# Patient Record
Sex: Female | Born: 1955 | Race: White | Hispanic: No
Health system: Southern US, Community
[De-identification: ages and names within clinical notes are randomized; demographics above are authoritative.]

## PROBLEM LIST (undated history)

## (undated) DIAGNOSIS — A419 Sepsis, unspecified organism: Secondary | ICD-10-CM

## (undated) DIAGNOSIS — J181 Lobar pneumonia, unspecified organism: Secondary | ICD-10-CM

## (undated) DIAGNOSIS — I482 Chronic atrial fibrillation, unspecified: Secondary | ICD-10-CM

## (undated) DIAGNOSIS — J9621 Acute and chronic respiratory failure with hypoxia: Secondary | ICD-10-CM

## (undated) DIAGNOSIS — J90 Pleural effusion, not elsewhere classified: Secondary | ICD-10-CM

## (undated) DIAGNOSIS — R6520 Severe sepsis without septic shock: Secondary | ICD-10-CM

## (undated) DIAGNOSIS — F419 Anxiety disorder, unspecified: Secondary | ICD-10-CM

## (undated) DIAGNOSIS — R609 Edema, unspecified: Secondary | ICD-10-CM

## (undated) DIAGNOSIS — J449 Chronic obstructive pulmonary disease, unspecified: Secondary | ICD-10-CM

## (undated) DIAGNOSIS — F0390 Unspecified dementia without behavioral disturbance: Secondary | ICD-10-CM

## (undated) DIAGNOSIS — F259 Schizoaffective disorder, unspecified: Secondary | ICD-10-CM

## (undated) DIAGNOSIS — R279 Unspecified lack of coordination: Secondary | ICD-10-CM

## (undated) DIAGNOSIS — F329 Major depressive disorder, single episode, unspecified: Secondary | ICD-10-CM

## (undated) HISTORY — PX: OTHER SURGICAL HISTORY: SHX169

---

## 2016-01-09 ENCOUNTER — Encounter (HOSPITAL_COMMUNITY): Payer: Self-pay | Admitting: Emergency Medicine

## 2016-01-09 ENCOUNTER — Emergency Department (HOSPITAL_COMMUNITY)
Admission: EM | Admit: 2016-01-09 | Discharge: 2016-01-09 | Disposition: A | Discharge status: Home / Self Care | Payer: Medicare Other | Attending: Emergency Medicine | Admitting: Emergency Medicine

## 2016-01-09 ENCOUNTER — Emergency Department (HOSPITAL_COMMUNITY): Payer: Medicare Other

## 2016-01-09 DIAGNOSIS — F259 Schizoaffective disorder, unspecified: Secondary | ICD-10-CM | POA: Diagnosis not present

## 2016-01-09 DIAGNOSIS — R05 Cough: Secondary | ICD-10-CM | POA: Insufficient documentation

## 2016-01-09 DIAGNOSIS — Z79899 Other long term (current) drug therapy: Secondary | ICD-10-CM | POA: Insufficient documentation

## 2016-01-09 DIAGNOSIS — F0390 Unspecified dementia without behavioral disturbance: Secondary | ICD-10-CM | POA: Diagnosis not present

## 2016-01-09 DIAGNOSIS — R059 Cough, unspecified: Secondary | ICD-10-CM

## 2016-01-09 HISTORY — DX: Chronic obstructive pulmonary disease, unspecified: J44.9

## 2016-01-09 HISTORY — DX: Edema, unspecified: R60.9

## 2016-01-09 HISTORY — DX: Unspecified dementia, unspecified severity, without behavioral disturbance, psychotic disturbance, mood disturbance, and anxiety: F03.90

## 2016-01-09 HISTORY — DX: Schizoaffective disorder, unspecified: F25.9

## 2016-01-09 HISTORY — DX: Major depressive disorder, single episode, unspecified: F32.9

## 2016-01-09 HISTORY — DX: Anxiety disorder, unspecified: F41.9

## 2016-01-09 HISTORY — DX: Unspecified lack of coordination: R27.9

## 2016-01-09 LAB — I-STAT CHEM 8, ED
BUN: 10 mg/dL (ref 6–20)
CHLORIDE: 98 mmol/L — AB (ref 101–111)
Calcium, Ion: 1.12 mmol/L (ref 1.12–1.23)
Creatinine, Ser: 0.6 mg/dL (ref 0.44–1.00)
Glucose, Bld: 109 mg/dL — ABNORMAL HIGH (ref 65–99)
HEMATOCRIT: 34 % — AB (ref 36.0–46.0)
Hemoglobin: 11.6 g/dL — ABNORMAL LOW (ref 12.0–15.0)
Potassium: 3.6 mmol/L (ref 3.5–5.1)
SODIUM: 140 mmol/L (ref 135–145)
TCO2: 31 mmol/L (ref 0–100)

## 2016-01-09 MED ORDER — DOXYCYCLINE HYCLATE 100 MG PO CAPS: 100.0000 mg | ORAL_CAPSULE | Freq: Two times a day (BID) | ORAL | Status: AC

## 2016-01-09 NOTE — ED Provider Notes (Signed)
The pt is a 60 y/o female with sig behavioural history - very poor historian Comes in with a couple of days of cough - lungs clear, no tachycardia CXR without obvious infiltrate - no fever here - pt to be placed on abx  I have personally viewed and interpreted the imaging and agree with radiologist interpretation.  Medical screening examination/treatment/procedure(s) were conducted as a shared visit with non-physician practitioner(s) and myself.  I personally evaluated the patient during the encounter.  Clinical Impression:   Final diagnoses:  Cough   Meds given in ED:  Medications - No data to display  Discharge Medication List as of 01/09/2016  3:38 PM    START taking these medications   Details  doxycycline (VIBRAMYCIN) 100 MG capsule Take 1 capsule (100 mg total) by mouth 2 (two) times daily., Starting 01/09/2016, Until Discontinued, Print          Eber HongBrian Lilie Vezina, MD 01/11/16 1400

## 2016-01-09 NOTE — ED Notes (Signed)
Pt sleeping soundly.  Awaiting transportation.  Sats 95% on 2L [place].  Did not disturb patient for repeat blood pressure at this time.

## 2016-01-09 NOTE — ED Notes (Signed)
Patient from [facility] in [place] with c/o Cough x 2 days. Noted hypoxia at 83% at facility. Febrile. Given 650 mg Tylenol at facility PTA. Baseline mental status. DNR

## 2016-01-09 NOTE — ED Notes (Signed)
[facility] by EMS.

## 2016-01-09 NOTE — ED Provider Notes (Signed)
CSN: [id number]     Arrival date & time 01/09/16  1342 History   First MD Initiated Contact with Patient 01/09/16 1343     Chief Complaint  Patient presents with  . Cough     (Consider location/radiation/quality/duration/timing/severity/associated sxs/prior Treatment) HPI   Patient is a 60 y/o female with a history of schizoaffective disorder, anxiety, COPD, and dementia who lives at a skilled nursing facility presents to the ED with one week of non-productive cough. Patient was uncooperative with the history. I spoke to Christine PeltonJan Doss, LPN from [facility] [place] who provided the history. She states the patient has had a non-productive cough for one week with associated sinus congestion and intermittent fever. They recorded her temp as 102.1 today. She states numerous patients in the facility have been sent out for similar symptoms.   Past Medical History  Diagnosis Date  . Schizoaffective disorder (HCC)   . Anxiety   . COPD (chronic obstructive pulmonary disease) (HCC)   . Edema   . MDD (major depressive disorder) (HCC)   . Dementia   . Unspecified lack of coordination    Past Surgical History  Procedure Laterality Date  . Patient unable to contribute to surgical history due to dementia/schizophrenia     No family history on file. Social History  Substance Use Topics  . Smoking status: Unknown If Ever Smoked  . Smokeless tobacco: None  . Alcohol Use: No   OB History    No data available     Review of Systems  Unable to perform ROS: Dementia      Allergies  Levaquin and Penicillins  Home Medications   Prior to Admission medications   Medication Sig Start Date End Date Taking? Authorizing Provider  acetaminophen (TYLENOL) 325 MG tablet Take 650 mg by mouth every 4 (four) hours as needed for mild pain.   Yes Historical Provider, MD  albuterol (PROVENTIL HFA;VENTOLIN HFA) 108 (90 Base) MCG/ACT inhaler Inhale 2 puffs into the lungs every 6 (six) hours as needed for  wheezing or shortness of breath.   Yes Historical Provider, MD  calcipotriene (DOVONOX) 0.005 % ointment Apply 1 application topically daily.   Yes Historical Provider, MD  divalproex (DEPAKOTE) 500 MG DR tablet Take 500 mg by mouth 3 (three) times daily.   Yes Historical Provider, MD  furosemide (LASIX) 20 MG tablet Take 20 mg by mouth daily.   Yes Historical Provider, MD  guaifenesin (ROBITUSSIN) 100 MG/5ML syrup Take 200 mg by mouth every 4 (four) hours as needed for cough or congestion.   Yes Historical Provider, MD  loratadine (CLARITIN) 10 MG tablet Take 10 mg by mouth daily as needed for allergies.   Yes Historical Provider, MD  LORazepam (ATIVAN) 1 MG tablet Take 1 mg by mouth every 8 (eight) hours as needed (emotional outburst).   Yes Historical Provider, MD  LORazepam (ATIVAN) 2 MG/ML concentrated solution Take 0.5 mg by mouth 2 (two) times daily as needed (emotional outburst).   Yes Historical Provider, MD  Multiple Vitamin (MULTIVITAMIN WITH MINERALS) TABS tablet Take 1 tablet by mouth daily.   Yes Historical Provider, MD  QUEtiapine (SEROQUEL XR) 200 MG 24 hr tablet Take 200 mg by mouth at bedtime.   Yes Historical Provider, MD  QUEtiapine (SEROQUEL XR) 400 MG 24 hr tablet Take 400 mg by mouth at bedtime.   Yes Historical Provider, MD  senna (SENOKOT) 8.6 MG TABS tablet Take 1 tablet by mouth 2 (two) times daily.   Yes Historical  Provider, MD  sertraline (ZOLOFT) 100 MG tablet Take 100 mg by mouth daily.   Yes Historical Provider, MD  doxycycline (VIBRAMYCIN) 100 MG capsule Take 1 capsule (100 mg total) by mouth 2 (two) times daily. 01/09/16   Cedar Ditullio L Tan Clopper, PA   BP 132/88 mmHg  Pulse 93  Temp(Src) 97.9 F (36.6 C) (Oral)  Resp 18  SpO2 94%  LMP  (LMP Unknown) Physical Exam  Constitutional: She appears well-developed and well-nourished. No distress.  HENT:  Head: Normocephalic and atraumatic.  Eyes: Conjunctivae are normal.  Neck: Normal range of motion.  Cardiovascular:  Normal rate, regular rhythm and normal heart sounds.   Pulmonary/Chest: Effort normal. She has no wheezes. She has no rales.  Noted decreased breath sounds of the left lower lung  Abdominal: Soft. She exhibits distension. Bowel sounds are increased. There is no tenderness.  Large reducible umbilical hernia   Musculoskeletal: Normal range of motion.  Neurological: She is alert. Coordination normal.  Skin: Skin is warm and dry.    ED Course  Procedures (including critical care time) Labs Review Labs Reviewed  I-STAT CHEM 8, ED - Abnormal; Notable for the following:    Chloride 98 (*)    Glucose, Bld 109 (*)    Hemoglobin 11.6 (*)    HCT 34.0 (*)    All other components within normal limits    Imaging Review Dg Chest Portable 1 View  01/09/2016  CLINICAL DATA:  Cough, several days duration.  Associated fever. EXAM: PORTABLE CHEST 1 VIEW COMPARISON:  None. FINDINGS: Heart size is normal. Mediastinal shadows are normal. The upper lungs are clear. No evidence of heart failure. Lung bases are poorly seen given the portable technique and poor inspiration. I think the right base is probably clear. Cannot rule out left lower lobe pneumonia. Consider two-view chest radiography when able. IMPRESSION: Upper lungs clear. Technical limitations at evaluation of the lower lungs. Left lower lobe pneumonia not excluded. Consider two-view chest radiography when able. Electronically Signed   By: Paulina Fusi M.D.   On: 01/09/2016 14:18   I have personally reviewed and evaluated these images and lab results as part of my medical decision-making.   MDM   Final diagnoses:  Cough    Patient has been diagnosed with cough. I reviewed the chest xray which stated, "cannot rule out left lower lobe pneumonia". Pt is not ill appearing, not immunocompromised, and lives at a skilled nursing facilty, therefore I feel like the they can be treated as an OP with abx therapy. Pt has been advised to return to the ED if  symptoms worsen or they do not improve. Nurse communicated plan of care with nurse at skilled nursing facility.      Jerre Simon, PA 01/09/16 1544  Eber Hong, MD 01/11/16 1400

## 2016-01-09 NOTE — ED Notes (Signed)
Pt very difficult to assess due to mental capacity.  Reports a cough for a few days and left ear pain.  Lung sounds diminished and assessment limited by body habitus and mobility.

## 2016-01-09 NOTE — Discharge Instructions (Signed)
Follow up with your primary care provider tomorrow.  Return if your symptoms worsen.  Cough, Adult A cough helps to clear your throat and lungs. A cough may last only 2-3 weeks (acute), or it may last longer than 8 weeks (chronic). Many different things can cause a cough. A cough may be a sign of an illness or another medical condition. HOME CARE  Pay attention to any changes in your cough.  Take medicines only as told by your doctor.  If you were prescribed an antibiotic medicine, take it as told by your doctor. Do not stop taking it even if you start to feel better.  Talk with your doctor before you try using a cough medicine.  Drink enough fluid to keep your pee (urine) clear or pale yellow.  If the air is dry, use a cold steam vaporizer or humidifier in your home.  Stay away from things that make you cough at work or at home.  If your cough is worse at night, try using extra pillows to raise your head up higher while you sleep.  Do not smoke, and try not to be around smoke. If you need help quitting, ask your doctor.  Do not have caffeine.  Do not drink alcohol.  Rest as needed. GET HELP IF:  You have new problems (symptoms).  You cough up yellow fluid (pus).  Your cough does not get better after 2-3 weeks, or your cough gets worse.  Medicine does not help your cough and you are not sleeping well.  You have pain that gets worse or pain that is not helped with medicine.  You have a fever.  You are losing weight and you do not know why.  You have night sweats. GET HELP RIGHT AWAY IF:  You cough up blood.  You have trouble breathing.  Your heartbeat is very fast.   This information is not intended to replace advice given to you by your health care provider. Make sure you discuss any questions you have with your health care provider.   Document Released: 05/24/2011 Document Revised: 06/01/2015 Document Reviewed: 11/17/2014 Elsevier Interactive Patient  Education [employer]2016 [employer] Inc.

## 2016-01-09 NOTE — ED Notes (Signed)
Report called to [facility] in [place] to [place]. Awaiting EMS transport back to SNF.

## 2019-01-20 ENCOUNTER — Other Ambulatory Visit (HOSPITAL_COMMUNITY): Payer: Medicare Other

## 2019-01-20 ENCOUNTER — Inpatient Hospital Stay
Admit: 2019-01-20 | Discharge: 2019-01-28 | Payer: Medicare Other | Source: Ambulatory Visit | Attending: Internal Medicine | Admitting: Internal Medicine

## 2019-01-20 DIAGNOSIS — J449 Chronic obstructive pulmonary disease, unspecified: Secondary | ICD-10-CM

## 2019-01-20 DIAGNOSIS — A419 Sepsis, unspecified organism: Secondary | ICD-10-CM | POA: Diagnosis present

## 2019-01-20 DIAGNOSIS — J189 Pneumonia, unspecified organism: Secondary | ICD-10-CM

## 2019-01-20 DIAGNOSIS — R6520 Severe sepsis without septic shock: Secondary | ICD-10-CM | POA: Diagnosis present

## 2019-01-20 DIAGNOSIS — J90 Pleural effusion, not elsewhere classified: Secondary | ICD-10-CM | POA: Diagnosis present

## 2019-01-20 DIAGNOSIS — I482 Chronic atrial fibrillation, unspecified: Secondary | ICD-10-CM | POA: Diagnosis present

## 2019-01-20 DIAGNOSIS — J9690 Respiratory failure, unspecified, unspecified whether with hypoxia or hypercapnia: Secondary | ICD-10-CM

## 2019-01-20 DIAGNOSIS — Z9889 Other specified postprocedural states: Secondary | ICD-10-CM

## 2019-01-20 DIAGNOSIS — R0902 Hypoxemia: Secondary | ICD-10-CM

## 2019-01-20 DIAGNOSIS — R7981 Abnormal blood-gas level: Secondary | ICD-10-CM

## 2019-01-20 DIAGNOSIS — J181 Lobar pneumonia, unspecified organism: Secondary | ICD-10-CM | POA: Diagnosis present

## 2019-01-20 DIAGNOSIS — J9621 Acute and chronic respiratory failure with hypoxia: Secondary | ICD-10-CM | POA: Diagnosis present

## 2019-01-20 HISTORY — DX: Chronic atrial fibrillation, unspecified: I48.20

## 2019-01-20 HISTORY — DX: Lobar pneumonia, unspecified organism: J18.1

## 2019-01-20 HISTORY — DX: Sepsis, unspecified organism: A41.9

## 2019-01-20 HISTORY — DX: Acute and chronic respiratory failure with hypoxia: J96.21

## 2019-01-20 HISTORY — DX: Severe sepsis without septic shock: R65.20

## 2019-01-20 HISTORY — DX: Pleural effusion, not elsewhere classified: J90

## 2019-01-21 DIAGNOSIS — R6520 Severe sepsis without septic shock: Secondary | ICD-10-CM

## 2019-01-21 DIAGNOSIS — J90 Pleural effusion, not elsewhere classified: Secondary | ICD-10-CM

## 2019-01-21 DIAGNOSIS — I482 Chronic atrial fibrillation, unspecified: Secondary | ICD-10-CM | POA: Diagnosis not present

## 2019-01-21 DIAGNOSIS — J449 Chronic obstructive pulmonary disease, unspecified: Secondary | ICD-10-CM

## 2019-01-21 DIAGNOSIS — A419 Sepsis, unspecified organism: Secondary | ICD-10-CM

## 2019-01-21 DIAGNOSIS — J181 Lobar pneumonia, unspecified organism: Secondary | ICD-10-CM | POA: Diagnosis not present

## 2019-01-21 DIAGNOSIS — J9621 Acute and chronic respiratory failure with hypoxia: Secondary | ICD-10-CM | POA: Diagnosis not present

## 2019-01-21 LAB — TSH: TSH: 1.397 u[IU]/mL (ref 0.350–4.500)

## 2019-01-21 LAB — HEMOGLOBIN A1C
Hgb A1c MFr Bld: 5.5 % (ref 4.8–5.6)
Mean Plasma Glucose: 111.15 mg/dL

## 2019-01-21 LAB — CBC WITH DIFFERENTIAL/PLATELET
Abs Immature Granulocytes: 0.15 10*3/uL — ABNORMAL HIGH (ref 0.00–0.07)
Basophils Absolute: 0 10*3/uL (ref 0.0–0.1)
Basophils Relative: 0 %
Eosinophils Absolute: 0 10*3/uL (ref 0.0–0.5)
Eosinophils Relative: 0 %
HCT: 37.7 % (ref 36.0–46.0)
Hemoglobin: 11.5 g/dL — ABNORMAL LOW (ref 12.0–15.0)
Immature Granulocytes: 1 %
Lymphocytes Relative: 8 %
Lymphs Abs: 0.8 10*3/uL (ref 0.7–4.0)
MCH: 30.7 pg (ref 26.0–34.0)
MCHC: 30.5 g/dL (ref 30.0–36.0)
MCV: 100.5 fL — ABNORMAL HIGH (ref 80.0–100.0)
Monocytes Absolute: 0.3 10*3/uL (ref 0.1–1.0)
Monocytes Relative: 3 %
Neutro Abs: 9.2 10*3/uL — ABNORMAL HIGH (ref 1.7–7.7)
Neutrophils Relative %: 88 %
Platelets: 330 10*3/uL (ref 150–400)
RBC: 3.75 MIL/uL — ABNORMAL LOW (ref 3.87–5.11)
RDW: 13.9 % (ref 11.5–15.5)
WBC: 10.5 10*3/uL (ref 4.0–10.5)
nRBC: 0 % (ref 0.0–0.2)

## 2019-01-21 LAB — COMPREHENSIVE METABOLIC PANEL
ALT: 31 U/L (ref 0–44)
AST: 29 U/L (ref 15–41)
Albumin: 2.1 g/dL — ABNORMAL LOW (ref 3.5–5.0)
Alkaline Phosphatase: 61 U/L (ref 38–126)
Anion gap: 7 (ref 5–15)
BUN: 15 mg/dL (ref 6–20)
CO2: 34 mmol/L — ABNORMAL HIGH (ref 22–32)
Calcium: 9 mg/dL (ref 8.9–10.3)
Chloride: 99 mmol/L (ref 98–111)
Creatinine, Ser: 0.52 mg/dL (ref 0.44–1.00)
GFR calc Af Amer: >60 mL/min (ref >60)
GFR calc non Af Amer: >60 mL/min (ref >60)
Glucose, Bld: 110 mg/dL — ABNORMAL HIGH (ref 70–99)
Potassium: 4.1 mmol/L (ref 3.5–5.1)
Sodium: 140 mmol/L (ref 135–145)
Total Bilirubin: 0.8 mg/dL (ref 0.3–1.2)
Total Protein: 6.7 g/dL (ref 6.5–8.1)

## 2019-01-21 LAB — URINALYSIS, ROUTINE W REFLEX MICROSCOPIC
Bilirubin Urine: NEGATIVE
Glucose, UA: NEGATIVE mg/dL
Ketones, ur: NEGATIVE mg/dL
Nitrite: NEGATIVE
Protein, ur: NEGATIVE mg/dL
Specific Gravity, Urine: 1.005 (ref 1.005–1.030)
pH: 6 (ref 5.0–8.0)

## 2019-01-21 LAB — MAGNESIUM: Magnesium: 2 mg/dL (ref 1.7–2.4)

## 2019-01-21 LAB — PROTIME-INR
INR: 1.4 — ABNORMAL HIGH (ref 0.8–1.2)
Prothrombin Time: 16.7 seconds — ABNORMAL HIGH (ref 11.4–15.2)

## 2019-01-21 LAB — PHOSPHORUS: Phosphorus: 3.2 mg/dL (ref 2.5–4.6)

## 2019-01-21 LAB — VANCOMYCIN, TROUGH: Vancomycin Tr: 18 ug/mL (ref 15–20)

## 2019-01-21 LAB — T4, FREE: Free T4: 0.84 ng/dL (ref 0.82–1.77)

## 2019-01-21 NOTE — Consult Note (Signed)
Pulmonary Critical Care Medicine [facility] [place]  PULMONARY SERVICE  Date of Service: 01/21/2019  PULMONARY CRITICAL CARE Maralynn Peaches  HWY:[id number]  DOB: Feb 13, 1956   DOA: 01/20/2019  Referring Physician: Carron Curie, MD  HPI: Christine Dunlap is a 63 y.o. female seen for follow up of Acute on Chronic Respiratory Failure.  Patient has multiple medical problems including dementia COPD anemia heart failure atrial fibrillation anxiety depression hypothyroidism.  [facility].  Evaluation was done and found to have pneumonia in the left side.  Patient also had a pleural effusion and pericardial effusion.  Patient also had developed atrial fibrillation.  Was worked up treated with antibiotics also underwent thoracentesis.  The antibiotics included vancomycin and meropenem.  As far as the pericardial effusion was concerned cardiology did not feel there was a need for window at this time.  Patient is now transferred to our facility for further management.  Review of Systems:  ROS performed and is unremarkable other than noted above.  Past Medical History:  Diagnosis Date   Anxiety    COPD (chronic obstructive pulmonary disease) (HCC)    Dementia    Edema    MDD (major depressive disorder)    Schizoaffective disorder (HCC)    Unspecified lack of coordination     Past Surgical History:  Procedure Laterality Date   Patient unable to contribute to surgical history due to dementia/schizophrenia      Social History:    reports that she does not drink alcohol or use drugs. No history on file for tobacco.  Family History: Non-Contributory to the present illness  Allergies  Allergen Reactions   Levaquin [Levofloxacin In D5w]    Penicillins     Medications: Reviewed on Rounds  Physical Exam:  Vitals: Temperature 97.7 pulse 90 respiratory rate 21 blood pressure 160/68 saturations  92%  Ventilator Settings currently on 40 L FiO2 80% heated high flow   General: Comfortable at this time  Eyes: Grossly normal lids, irises & conjunctiva  ENT: grossly tongue is normal  Neck: no obvious mass  Cardiovascular: S1-S2 normal no gallop or rub  Respiratory: Coarse breath sounds noted bilaterally  Abdomen: Soft nontender  Skin: no rash seen on limited exam  Musculoskeletal: not rigid  Psychiatric:unable to assess  Neurologic: no seizure no involuntary movements         Labs on Admission:  Basic Metabolic Panel: Recent Labs  Lab 01/21/19 0439  NA 140  K 4.1  CL 99  CO2 34*  GLUCOSE 110*  BUN 15  CREATININE 0.52  CALCIUM 9.0  MG 2.0  PHOS 3.2    No results for input(s): PHART, PCO2ART, PO2ART, HCO3, O2SAT in the last 168 hours.  Liver Function Tests: Recent Labs  Lab 01/21/19 0439  AST 29  ALT 31  ALKPHOS 61  BILITOT 0.8  PROT 6.7  ALBUMIN 2.1*   No results for input(s): LIPASE, AMYLASE in the last 168 hours. No results for input(s): AMMONIA in the last 168 hours.  CBC: Recent Labs  Lab 01/21/19 0439  WBC 10.5  NEUTROABS 9.2*  HGB 11.5*  HCT 37.7  MCV 100.5*  PLT 330    Cardiac Enzymes: No results for input(s): CKTOTAL, CKMB, CKMBINDEX, TROPONINI in the last 168 hours.  BNP (last 3 results) No results for input(s): BNP in the last 8760 hours.  ProBNP (last 3 results) No results for input(s): PROBNP in the last 8760 hours.  Radiological Exams on Admission: Dg Chest Port 1 View  Result Date: 01/20/2019 CLINICAL DATA:  Respiratory failure EXAM: PORTABLE CHEST 1 VIEW COMPARISON:  01/09/2016 FINDINGS: Cardiac enlargement. Progression of moderately large bilateral pleural effusions. Progression of bibasilar atelectasis. Negative for edema Chronic fracture right humeral neck. IMPRESSION: Progression of bilateral effusions and bibasilar atelectasis likely due to fluid overload. Negative for pulmonary edema. Electronically Signed    By: Marlan Palauharles  Clark M.D.   On: 01/20/2019 19:19       Assessment/Plan Active Problems:   Acute on chronic respiratory failure with hypoxia (HCC)   Severe sepsis (HCC)   Pleural effusion, left   Lobar pneumonia, unspecified organism (HCC)   Chronic atrial fibrillation with rapid ventricular response   1. Acute on chronic respiratory failure with hypoxia patient remains on full support on the heated high flow at this time requiring 80% FiO2.  Chest x-ray showed progression with fluid overload likely would recommend aggressive diuresis to try to improve her fluid status.  This may help us to wean the FiO2 down 2. Severe sepsis right now is hemodynamically stable however patient had a possible empyema chest tube was not placed.  There was progression of bilateral effusions as noted above 3. Bilateral pleural effusions on the chest x-ray appeared to be progressive.  Patient has had thoracentesis done.  However I think a repeat thoracentesis may be necessary at this point 4. Lobar pneumonia treated with vancomycin and meropenem.  Patient also received cefepime and Flagyl. 5. Chronic atrial fibrillation right now the rate is controlled patient has been on beta-blockers and digoxin.  Supportive care  I have personally seen and evaluated the patient, evaluated laboratory and imaging results, formulated the assessment and plan and placed orders. The Patient requires high complexity decision making for assessment and support.  Case was discussed on Rounds with the Respiratory Therapy Staff Time Spent 70minutes  Yevonne PaxSaadat A Adrik Khim, MD [facility] Pulmonary Critical Care Medicine Sleep Medicine

## 2019-01-22 ENCOUNTER — Encounter: Payer: Self-pay | Admitting: Internal Medicine

## 2019-01-22 DIAGNOSIS — J9621 Acute and chronic respiratory failure with hypoxia: Secondary | ICD-10-CM | POA: Diagnosis present

## 2019-01-22 DIAGNOSIS — J449 Chronic obstructive pulmonary disease, unspecified: Secondary | ICD-10-CM | POA: Diagnosis not present

## 2019-01-22 DIAGNOSIS — I482 Chronic atrial fibrillation, unspecified: Secondary | ICD-10-CM | POA: Diagnosis present

## 2019-01-22 DIAGNOSIS — A419 Sepsis, unspecified organism: Secondary | ICD-10-CM | POA: Diagnosis present

## 2019-01-22 DIAGNOSIS — J181 Lobar pneumonia, unspecified organism: Secondary | ICD-10-CM | POA: Diagnosis present

## 2019-01-22 DIAGNOSIS — J90 Pleural effusion, not elsewhere classified: Secondary | ICD-10-CM | POA: Diagnosis present

## 2019-01-22 DIAGNOSIS — R6520 Severe sepsis without septic shock: Secondary | ICD-10-CM

## 2019-01-22 LAB — MAGNESIUM: Magnesium: 1.8 mg/dL (ref 1.7–2.4)

## 2019-01-22 LAB — CBC
HCT: 37.3 % (ref 36.0–46.0)
Hemoglobin: 11.7 g/dL — ABNORMAL LOW (ref 12.0–15.0)
MCH: 31.1 pg (ref 26.0–34.0)
MCHC: 31.4 g/dL (ref 30.0–36.0)
MCV: 99.2 fL (ref 80.0–100.0)
Platelets: 340 10*3/uL (ref 150–400)
RBC: 3.76 MIL/uL — ABNORMAL LOW (ref 3.87–5.11)
RDW: 13.9 % (ref 11.5–15.5)
WBC: 10 10*3/uL (ref 4.0–10.5)
nRBC: 0 % (ref 0.0–0.2)

## 2019-01-22 LAB — URINE CULTURE: Culture: NO GROWTH

## 2019-01-22 LAB — BASIC METABOLIC PANEL
Anion gap: 7 (ref 5–15)
BUN: 17 mg/dL (ref 6–20)
CO2: 33 mmol/L — ABNORMAL HIGH (ref 22–32)
Calcium: 8.7 mg/dL — ABNORMAL LOW (ref 8.9–10.3)
Chloride: 99 mmol/L (ref 98–111)
Creatinine, Ser: 0.55 mg/dL (ref 0.44–1.00)
GFR calc Af Amer: >60 mL/min (ref >60)
GFR calc non Af Amer: >60 mL/min (ref >60)
Glucose, Bld: 117 mg/dL — ABNORMAL HIGH (ref 70–99)
Potassium: 4.3 mmol/L (ref 3.5–5.1)
Sodium: 139 mmol/L (ref 135–145)

## 2019-01-22 LAB — PHOSPHORUS: Phosphorus: 3 mg/dL (ref 2.5–4.6)

## 2019-01-22 NOTE — Progress Notes (Addendum)
Pulmonary Critical Care Medicine [employer] [facility] [place]   PULMONARY CRITICAL CARE SERVICE  PROGRESS NOTE  Date of Service: 01/22/2019  Christine Dunlap  JWL:[id number]  DOB: December 06, 1955   DOA: 01/20/2019  Referring Physician: Carron Curie, MD  HPI: Christine Dunlap is a 63 y.o. female seen for follow up of Acute on Chronic Respiratory Failure.  Patient continues on heated high flow nasal cannula at this time 30 L and 80% FiO2.  Medications: Reviewed on Rounds  Physical Exam:  Vitals: Pulse 57 respirations 12 BP 148/85 O2 sat 94% temp 97.1  Ventilator Settings heated high flow 30 L 80% FiO2  . General: Comfortable at this time . Eyes: Grossly normal lids, irises & conjunctiva . ENT: grossly tongue is normal . Neck: no obvious mass . Cardiovascular: S1 S2 normal no gallop . Respiratory: Coarse breath sounds . Abdomen: soft . Skin: no rash seen on limited exam . Musculoskeletal: not rigid . Psychiatric:unable to assess . Neurologic: no seizure no involuntary movements         Lab Data:   Basic Metabolic Panel: Recent Labs  Lab 01/21/19 0439 01/22/19 0500  NA 140 139  K 4.1 4.3  CL 99 99  CO2 34* 33*  GLUCOSE 110* 117*  BUN 15 17  CREATININE 0.52 0.55  CALCIUM 9.0 8.7*  MG 2.0 1.8  PHOS 3.2 3.0    ABG: No results for input(s): PHART, PCO2ART, PO2ART, HCO3, O2SAT in the last 168 hours.  Liver Function Tests: Recent Labs  Lab 01/21/19 0439  AST 29  ALT 31  ALKPHOS 61  BILITOT 0.8  PROT 6.7  ALBUMIN 2.1*   No results for input(s): LIPASE, AMYLASE in the last 168 hours. No results for input(s): AMMONIA in the last 168 hours.  CBC: Recent Labs  Lab 01/21/19 0439 01/22/19 0500  WBC 10.5 10.0  NEUTROABS 9.2*  --   HGB 11.5* 11.7*  HCT 37.7 37.3  MCV 100.5* 99.2  PLT 330 340    Cardiac Enzymes: No results for input(s): CKTOTAL, CKMB, CKMBINDEX, TROPONINI in the last 168 hours.  BNP (last 3 results) No results for input(s): BNP in  the last 8760 hours.  ProBNP (last 3 results) No results for input(s): PROBNP in the last 8760 hours.  Radiological Exams: Dg Chest Port 1 View  Result Date: 01/20/2019 CLINICAL DATA:  Respiratory failure EXAM: PORTABLE CHEST 1 VIEW COMPARISON:  01/09/2016 FINDINGS: Cardiac enlargement. Progression of moderately large bilateral pleural effusions. Progression of bibasilar atelectasis. Negative for edema Chronic fracture right humeral neck. IMPRESSION: Progression of bilateral effusions and bibasilar atelectasis likely due to fluid overload. Negative for pulmonary edema. Electronically Signed   By: Marlan Palau M.D.   On: 01/20/2019 19:19    Assessment/Plan Active Problems:   Acute on chronic respiratory failure with hypoxia (HCC)   Severe sepsis (HCC)   Pleural effusion, left   Lobar pneumonia, unspecified organism (HCC)   Chronic atrial fibrillation with rapid ventricular response   1. Acute on chronic respiratory failure with hypoxia patient remains on heated high flow nasal cannula at this time 30 L and 80%.  Her chest x-ray continues to show fluid progression and will likely need a thoracentesis if aggressive diuresis does not work. 2. Severe sepsis hemodynamically stable 3. Bilateral pleural effusions on chest x-ray appears to be progressing.  Repeat thoracentesis may be necessary. 4. Lobar pneumonia treated with vancomycin and meropenem as well as cefepime and Flagyl. 5. Chronic atrial fibrillation rate controlled continue supportive care  I have personally seen and evaluated the patient, evaluated laboratory and imaging results, formulated the assessment and plan and placed orders. The Patient requires high complexity decision making for assessment and support.  Case was discussed on Rounds with the Respiratory Therapy Staff  Allyne Gee, MD [facility] Pulmonary Critical Care Medicine Sleep Medicine

## 2019-01-23 ENCOUNTER — Other Ambulatory Visit (HOSPITAL_COMMUNITY): Payer: Medicare Other

## 2019-01-23 DIAGNOSIS — J181 Lobar pneumonia, unspecified organism: Secondary | ICD-10-CM | POA: Diagnosis not present

## 2019-01-23 DIAGNOSIS — J9621 Acute and chronic respiratory failure with hypoxia: Secondary | ICD-10-CM | POA: Diagnosis not present

## 2019-01-23 DIAGNOSIS — I482 Chronic atrial fibrillation, unspecified: Secondary | ICD-10-CM | POA: Diagnosis not present

## 2019-01-23 DIAGNOSIS — J449 Chronic obstructive pulmonary disease, unspecified: Secondary | ICD-10-CM | POA: Diagnosis not present

## 2019-01-23 NOTE — Consult Note (Signed)
Referring Physician:  Lysbeth GalasKimberly Dunlap is an 63 y.o. female.                       Chief Complaint: Large pleural effusion  HPI: 63 year old female from area [facility] has acute on chronic respiratory failure with large bilateral pleural effusions. She has trivial pericardial effusion on echocardiogram. She has past medical history of dementia, COPD, atrial fibrillation, hypothyroidism and pneumonia.  Past Medical History:  Diagnosis Date  . Acute on chronic respiratory failure with hypoxia (HCC)   . Anxiety   . Chronic atrial fibrillation with rapid ventricular response   . COPD (chronic obstructive pulmonary disease) (HCC)   . Dementia (HCC)   . Edema   . Lobar pneumonia, unspecified organism (HCC)   . MDD (major depressive disorder)   . Pleural effusion, left   . Schizoaffective disorder (HCC)   . Severe sepsis (HCC)   . Unspecified lack of coordination       Past Surgical History:  Procedure Laterality Date  . Patient unable to contribute to surgical history due to dementia/schizophrenia      No family history on file. Social History:  reports that she does not drink alcohol or use drugs. No history on file for tobacco.  Allergies:  Allergies  Allergen Reactions  . Levaquin [Levofloxacin In D5w]   . Penicillins     Medications Prior to Admission  Medication Sig Dispense Refill  . acetaminophen (TYLENOL) 325 MG tablet Take 650 mg by mouth every 4 (four) hours as needed for mild pain.    [name]. albuterol (PROVENTIL HFA;VENTOLIN HFA) 108 (90 Base) MCG/ACT inhaler Inhale 2 puffs into the lungs every 6 (six) hours as needed for wheezing or shortness of breath.    . calcipotriene (DOVONOX) 0.005 % ointment Apply 1 application topically daily.    . divalproex (DEPAKOTE) 500 MG DR tablet Take 500 mg by mouth 3 (three) times daily.    [name]. doxycycline (VIBRAMYCIN) 100 MG capsule Take 1 capsule (100 mg total) by mouth 2 (two) times daily. 20 capsule 0  . furosemide (LASIX) 20 MG tablet  Take 20 mg by mouth daily.    [name]. guaifenesin (ROBITUSSIN) 100 MG/5ML syrup Take 200 mg by mouth every 4 (four) hours as needed for cough or congestion.    [name]. loratadine (CLARITIN) 10 MG tablet Take 10 mg by mouth daily as needed for allergies.    [name]. LORazepam (ATIVAN) 1 MG tablet Take 1 mg by mouth every 8 (eight) hours as needed (emotional outburst).    . LORazepam (ATIVAN) 2 MG/ML concentrated solution Take 0.5 mg by mouth 2 (two) times daily as needed (emotional outburst).    . Multiple Vitamin (MULTIVITAMIN WITH MINERALS) TABS tablet Take 1 tablet by mouth daily.    . QUEtiapine (SEROQUEL XR) 200 MG 24 hr tablet Take 200 mg by mouth at bedtime.    [name]. QUEtiapine (SEROQUEL XR) 400 MG 24 hr tablet Take 400 mg by mouth at bedtime.    . senna (SENOKOT) 8.6 MG TABS tablet Take 1 tablet by mouth 2 (two) times daily.    . sertraline (ZOLOFT) 100 MG tablet Take 100 mg by mouth daily.      Results for orders placed or performed during the hospital encounter of 01/20/19 (from the past 48 hour(s))  Vancomycin, trough     Status: None   Collection Time: 01/21/19  9:33 PM  Result Value Ref Range   Vancomycin Tr 18 15 -  20 ug/mL    Comment: Performed at [facility] Lab, [address], [place], [place] [address]  CBC     Status: Abnormal   Collection Time: 01/22/19  5:00 AM  Result Value Ref Range   WBC 10.0 4.0 - 10.5 K/uL   RBC 3.76 (L) 3.87 - 5.11 MIL/uL   Hemoglobin 11.7 (L) 12.0 - 15.0 g/dL   HCT 22.6 33.3 - 54.5 %   MCV 99.2 80.0 - 100.0 fL   MCH 31.1 26.0 - 34.0 pg   MCHC 31.4 30.0 - 36.0 g/dL   RDW 62.5 63.8 - 93.7 %   Platelets 340 150 - 400 K/uL   nRBC 0.0 0.0 - 0.2 %    Comment: Performed at [facility], [address], [place], [place] [address]  Basic metabolic panel     Status: Abnormal   Collection Time: 01/22/19  5:00 AM  Result Value Ref Range   Sodium 139 135 - 145 mmol/L   Potassium 4.3 3.5 - 5.1 mmol/L   Chloride 99 98 - 111 mmol/L   CO2 33 (H) 22 - 32 mmol/L    Glucose, Bld 117 (H) 70 - 99 mg/dL   BUN 17 6 - 20 mg/dL   Creatinine, Ser 6.81 0.44 - 1.00 mg/dL   Calcium 8.7 (L) 8.9 - 10.3 mg/dL   GFR calc non Af Amer >60 >60 mL/min   GFR calc Af Amer >60 >60 mL/min   Anion gap 7 5 - 15    Comment: Performed at [facility] Lab, [address], [place], [place] [address]  Magnesium     Status: None   Collection Time: 01/22/19  5:00 AM  Result Value Ref Range   Magnesium 1.8 1.7 - 2.4 mg/dL    Comment: Performed at [facility] Lab, [address] [place], [place] [address]  Phosphorus     Status: None   Collection Time: 01/22/19  5:00 AM  Result Value Ref Range   Phosphorus 3.0 2.5 - 4.6 mg/dL    Comment: Performed at [facility] Lab, [address], [place], [place] [address]   Dg Chest Port 1 View  Result Date: 01/23/2019 CLINICAL DATA:  Chronic obstructive pulmonary disease. EXAM: PORTABLE CHEST 1 VIEW COMPARISON:  Radiograph of January 20, 2019. FINDINGS: Stable cardiomegaly. No pneumothorax is noted. Bilateral pleural effusions are noted with associated atelectasis. Old right proximal humeral fracture is noted. IMPRESSION: Bilateral pleural effusions are noted with associated atelectasis. Electronically Signed   By: Lupita Raider M.D.   On: 01/23/2019 07:31    Review Of Systems As per PMH. Unable to obtain from patient.   There were no vitals taken for this visit. There is no height or weight on file to calculate BMI. General appearance: alert, cooperative, appears stated age and mild respiratory distress Head: Normocephalic, atraumatic. Eyes: Brown eyes, pink conjunctiva, corneas clear.  Neck: No adenopathy, no carotid bruit, no JVD, supple, symmetrical, trachea midline and thyroid not enlarged. Resp: Clearing to auscultation bilaterally. Cardio: Irregular rate and rhythm, S1, S2 normal, II/VI systolic murmur, no click, rub or gallop GI: Soft, non-tender; bowel sounds normal; no organomegaly. Extremities: No edema, cyanosis or  clubbing. Skin: Warm and dry.  Neurologic: Alert and oriented X 0, normal strength.   Assessment/Plan Bilateral large pleural effusion. Small pericardial effusion without tamponade Atrial fibrillation COPD Hypothyroidism Dementia  Continue medical treatment. Agree with thoracentesis per pulmonary.  Ricki Rodriguez, MD  01/23/2019, 7:56  PM

## 2019-01-23 NOTE — Progress Notes (Addendum)
Pulmonary Critical Care Medicine [facility] [place]   PULMONARY CRITICAL CARE SERVICE  PROGRESS NOTE  Date of Service: 01/23/2019  Christine Dunlap  YOK:[id number]  DOB: [birth year]   DOA: 01/20/2019  Referring Physician: Carron Curie, MD  HPI: Christine Dunlap is a 63 y.o. female seen for follow up of Acute on Chronic Respiratory Failure.  Patient remains on heated high flow nasal cannula 30 L and 70% FiO2.  Currently satting in the low 90s no distress noted.  Medications: Reviewed on Rounds  Physical Exam:  Vitals: Pulse 62 respirations 15 BP 121/65 O2 sat 92% temp 96.1  Ventilator Settings heated high flow 30 L 70% FiO2  . General: Comfortable at this time . Eyes: Grossly normal lids, irises & conjunctiva . ENT: grossly tongue is normal . Neck: no obvious mass . Cardiovascular: S1 S2 normal no gallop . Respiratory: Coarse breath sounds . Abdomen: soft . Skin: no rash seen on limited exam . Musculoskeletal: not rigid . Psychiatric:unable to assess . Neurologic: no seizure no involuntary movements         Lab Data:   Basic Metabolic Panel: Recent Labs  Lab 01/21/19 0439 01/22/19 0500  NA 140 139  K 4.1 4.3  CL 99 99  CO2 34* 33*  GLUCOSE 110* 117*  BUN 15 17  CREATININE 0.52 0.55  CALCIUM 9.0 8.7*  MG 2.0 1.8  PHOS 3.2 3.0    ABG: No results for input(s): PHART, PCO2ART, PO2ART, HCO3, O2SAT in the last 168 hours.  Liver Function Tests: Recent Labs  Lab 01/21/19 0439  AST 29  ALT 31  ALKPHOS 61  BILITOT 0.8  PROT 6.7  ALBUMIN 2.1*   No results for input(s): LIPASE, AMYLASE in the last 168 hours. No results for input(s): AMMONIA in the last 168 hours.  CBC: Recent Labs  Lab 01/21/19 0439 01/22/19 0500  WBC 10.5 10.0  NEUTROABS 9.2*  --   HGB 11.5* 11.7*  HCT 37.7 37.3  MCV 100.5* 99.2  PLT 330 340    Cardiac Enzymes: No results for input(s): CKTOTAL, CKMB, CKMBINDEX, TROPONINI in the last 168 hours.  BNP (last 3  results) No results for input(s): BNP in the last 8760 hours.  ProBNP (last 3 results) No results for input(s): PROBNP in the last 8760 hours.  Radiological Exams: Dg Chest Port 1 View  Result Date: 01/23/2019 CLINICAL DATA:  Chronic obstructive pulmonary disease. EXAM: PORTABLE CHEST 1 VIEW COMPARISON:  Radiograph of January 20, 2019. FINDINGS: Stable cardiomegaly. No pneumothorax is noted. Bilateral pleural effusions are noted with associated atelectasis. Old right proximal humeral fracture is noted. IMPRESSION: Bilateral pleural effusions are noted with associated atelectasis. Electronically Signed   By: Lupita Raider M.D.   On: 01/23/2019 07:31    Assessment/Plan Active Problems:   Acute on chronic respiratory failure with hypoxia (HCC)   Severe sepsis (HCC)   Pleural effusion, left   Lobar pneumonia, unspecified organism (HCC)   Chronic atrial fibrillation with rapid ventricular response   1. Acute on chronic respiratory failure with hypoxia patient remains on heated high flow nasal cannula at this time 30 L and 70% FiO2.  Continue aggressive pulmonary toilet and secretion management. 2. Severe sepsis hemodynamically stable 3. Bilateral pleural effusions on chest x-ray appears to be progressing a repeat thoracentesis may be necessary if the IV diuresis is unsuccessful 4. Lobar pneumonia treated with vancomycin and meropenem as well as cefepime and Flagyl continue to monitor 5. Chronic atrial fibrillation rate controlled continue  supportive care   I have personally seen and evaluated the patient, evaluated laboratory and imaging results, formulated the assessment and plan and placed orders. The Patient requires high complexity decision making for assessment and support.  Case was discussed on Rounds with the Respiratory Therapy Staff  Yevonne Pax, MD [facility] Pulmonary Critical Care Medicine Sleep Medicine

## 2019-01-23 NOTE — Progress Notes (Signed)
  Echocardiogram 2D Echocardiogram has been performed.  Christine Dunlap Christine Dunlap 01/23/2019, 1:56 PM

## 2019-01-24 DIAGNOSIS — J181 Lobar pneumonia, unspecified organism: Secondary | ICD-10-CM | POA: Diagnosis not present

## 2019-01-24 DIAGNOSIS — J9621 Acute and chronic respiratory failure with hypoxia: Secondary | ICD-10-CM | POA: Diagnosis not present

## 2019-01-24 DIAGNOSIS — I482 Chronic atrial fibrillation, unspecified: Secondary | ICD-10-CM | POA: Diagnosis not present

## 2019-01-24 DIAGNOSIS — J449 Chronic obstructive pulmonary disease, unspecified: Secondary | ICD-10-CM | POA: Diagnosis not present

## 2019-01-24 LAB — PHOSPHORUS: Phosphorus: 3.5 mg/dL (ref 2.5–4.6)

## 2019-01-24 LAB — CBC
HCT: 35.7 % — ABNORMAL LOW (ref 36.0–46.0)
Hemoglobin: 11.4 g/dL — ABNORMAL LOW (ref 12.0–15.0)
MCH: 31.5 pg (ref 26.0–34.0)
MCHC: 31.9 g/dL (ref 30.0–36.0)
MCV: 98.6 fL (ref 80.0–100.0)
Platelets: 308 10*3/uL (ref 150–400)
RBC: 3.62 MIL/uL — ABNORMAL LOW (ref 3.87–5.11)
RDW: 14 % (ref 11.5–15.5)
WBC: 11.5 10*3/uL — ABNORMAL HIGH (ref 4.0–10.5)
nRBC: 0 % (ref 0.0–0.2)

## 2019-01-24 LAB — BASIC METABOLIC PANEL
Anion gap: 9 (ref 5–15)
BUN: 21 mg/dL — ABNORMAL HIGH (ref 6–20)
CO2: 32 mmol/L (ref 22–32)
Calcium: 9 mg/dL (ref 8.9–10.3)
Chloride: 100 mmol/L (ref 98–111)
Creatinine, Ser: 0.47 mg/dL (ref 0.44–1.00)
GFR calc Af Amer: >60 mL/min (ref >60)
GFR calc non Af Amer: >60 mL/min (ref >60)
Glucose, Bld: 125 mg/dL — ABNORMAL HIGH (ref 70–99)
Potassium: 3.9 mmol/L (ref 3.5–5.1)
Sodium: 141 mmol/L (ref 135–145)

## 2019-01-24 LAB — MAGNESIUM: Magnesium: 1.9 mg/dL (ref 1.7–2.4)

## 2019-01-24 NOTE — Progress Notes (Addendum)
Pulmonary Critical Care Medicine [facility] [place]   PULMONARY CRITICAL CARE SERVICE  PROGRESS NOTE  Date of Service: 01/24/2019  Christine Dunlap  [id number]:[id number]  DOB: 02/16/1956   DOA: 01/20/2019  Referring Physician: Carron Curie, MD  HPI: Christine Dunlap is a 63 y.o. female seen for follow up of Acute on Chronic Respiratory Failure.  Patient is currently on 5 L of oxygen via nasal cannula satting well with no distress noted.  Medications: Reviewed on Rounds  Physical Exam:  Vitals: Pulse 100 respirations 25 BP 113/68 O2 sat 95% temp 97.1  Ventilator Settings 5 L nasal cannula  . General: Comfortable at this time . Eyes: Grossly normal lids, irises & conjunctiva . ENT: grossly tongue is normal . Neck: no obvious mass . Cardiovascular: S1 S2 normal no gallop . Respiratory: Coarse breath sounds . Abdomen: soft . Skin: no rash seen on limited exam . Musculoskeletal: not rigid . Psychiatric:unable to assess . Neurologic: no seizure no involuntary movements         Lab Data:   Basic Metabolic Panel: Recent Labs  Lab 01/21/19 0439 01/22/19 0500 01/24/19 0326  NA 140 139 141  K 4.1 4.3 3.9  CL 99 99 100  CO2 34* 33* 32  GLUCOSE 110* 117* 125*  BUN 15 17 21*  CREATININE 0.52 0.55 0.47  CALCIUM 9.0 8.7* 9.0  MG 2.0 1.8 1.9  PHOS 3.2 3.0 3.5    ABG: No results for input(s): PHART, PCO2ART, PO2ART, HCO3, O2SAT in the last 168 hours.  Liver Function Tests: Recent Labs  Lab 01/21/19 0439  AST 29  ALT 31  ALKPHOS 61  BILITOT 0.8  PROT 6.7  ALBUMIN 2.1*   No results for input(s): LIPASE, AMYLASE in the last 168 hours. No results for input(s): AMMONIA in the last 168 hours.  CBC: Recent Labs  Lab 01/21/19 0439 01/22/19 0500 01/24/19 0326  WBC 10.5 10.0 11.5*  NEUTROABS 9.2*  --   --   HGB 11.5* 11.7* 11.4*  HCT 37.7 37.3 35.7*  MCV 100.5* 99.2 98.6  PLT 330 340 308    Cardiac Enzymes: No results for input(s): CKTOTAL, CKMB,  CKMBINDEX, TROPONINI in the last 168 hours.  BNP (last 3 results) No results for input(s): BNP in the last 8760 hours.  ProBNP (last 3 results) No results for input(s): PROBNP in the last 8760 hours.  Radiological Exams: Dg Chest Port 1 View  Result Date: 01/23/2019 CLINICAL DATA:  Chronic obstructive pulmonary disease. EXAM: PORTABLE CHEST 1 VIEW COMPARISON:  Radiograph of January 20, 2019. FINDINGS: Stable cardiomegaly. No pneumothorax is noted. Bilateral pleural effusions are noted with associated atelectasis. Old right proximal humeral fracture is noted. IMPRESSION: Bilateral pleural effusions are noted with associated atelectasis. Electronically Signed   By: Lupita Raider M.D.   On: 01/23/2019 07:31    Assessment/Plan Active Problems:   Acute on chronic respiratory failure with hypoxia (HCC)   Severe sepsis (HCC)   Pleural effusion, left   Lobar pneumonia, unspecified organism (HCC)   Chronic atrial fibrillation with rapid ventricular response   1. Acute on chronic respiratory failure with hypoxia patient is currently on 5 L of oxygen via nasal cannula satting mid 90s at this time no distress noted. 2. Severe sepsis hemodynamically stable 3. Bilateral pleural effusions on chest x-ray continue to monitor 4. Lobar pneumonia treated continue to monitor 5. Chronic atrial fibrillation rate controlled continue supportive care   I have personally seen and evaluated the patient, evaluated laboratory  and imaging results, formulated the assessment and plan and placed orders. The Patient requires high complexity decision making for assessment and support.  Case was discussed on Rounds with the Respiratory Therapy Staff  Allyne Gee, MD [facility] Pulmonary Critical Care Medicine Sleep Medicine

## 2019-01-25 ENCOUNTER — Other Ambulatory Visit (HOSPITAL_COMMUNITY): Payer: Medicare Other

## 2019-01-25 DIAGNOSIS — J9621 Acute and chronic respiratory failure with hypoxia: Secondary | ICD-10-CM | POA: Diagnosis not present

## 2019-01-25 DIAGNOSIS — J449 Chronic obstructive pulmonary disease, unspecified: Secondary | ICD-10-CM | POA: Diagnosis not present

## 2019-01-25 DIAGNOSIS — J181 Lobar pneumonia, unspecified organism: Secondary | ICD-10-CM | POA: Diagnosis not present

## 2019-01-25 DIAGNOSIS — I482 Chronic atrial fibrillation, unspecified: Secondary | ICD-10-CM | POA: Diagnosis not present

## 2019-01-25 NOTE — Progress Notes (Addendum)
Pulmonary Critical Care [facility] [place]   PULMONARY CRITICAL CARE SERVICE  PROGRESS NOTE  Date of Service: 01/25/2019  Christine Dunlap  ZOX:[id number]  DOB: 1956-05-03   DOA: 01/20/2019  Referring Physician: Carron CurieAli Hijazi, MD  HPI: Christine Dunlap is a 63 y.o. female seen for follow up of Acute on Chronic Respiratory Failure.  Patient remains on 5 L of oxygen via nasal cannula.  Staff reports patient is refusing any and all medications.  Medications: Reviewed on Rounds  Physical Exam:  Vitals: Pulse 76 respirations 20 BP 130/67 O2 sat 97% temp 97.3  Ventilator Settings 5 L nasal cannula  . General: Comfortable at this time . Eyes: Grossly normal lids, irises & conjunctiva . ENT: grossly tongue is normal . Neck: no obvious mass . Cardiovascular: S1 S2 normal no gallop . Respiratory: Coarse breath sounds . Abdomen: soft . Skin: no rash seen on limited exam . Musculoskeletal: not rigid . Psychiatric:unable to assess . Neurologic: no seizure no involuntary movements         Lab Data:   Basic Metabolic Panel: Recent Labs  Lab 01/21/19 0439 01/22/19 0500 01/24/19 0326  NA 140 139 141  K 4.1 4.3 3.9  CL 99 99 100  CO2 34* 33* 32  GLUCOSE 110* 117* 125*  BUN 15 17 21*  CREATININE 0.52 0.55 0.47  CALCIUM 9.0 8.7* 9.0  MG 2.0 1.8 1.9  PHOS 3.2 3.0 3.5    ABG: No results for input(s): PHART, PCO2ART, PO2ART, HCO3, O2SAT in the last 168 hours.  Liver Function Tests: Recent Labs  Lab 01/21/19 0439  AST 29  ALT 31  ALKPHOS 61  BILITOT 0.8  PROT 6.7  ALBUMIN 2.1*   No results for input(s): LIPASE, AMYLASE in the last 168 hours. No results for input(s): AMMONIA in the last 168 hours.  CBC: Recent Labs  Lab 01/21/19 0439 01/22/19 0500 01/24/19 0326  WBC 10.5 10.0 11.5*  NEUTROABS 9.2*  --   --   HGB 11.5* 11.7* 11.4*  HCT 37.7 37.3 35.7*  MCV 100.5* 99.2 98.6  PLT 330 340 308    Cardiac Enzymes: No results for input(s):  CKTOTAL, CKMB, CKMBINDEX, TROPONINI in the last 168 hours.  BNP (last 3 results) No results for input(s): BNP in the last 8760 hours.  ProBNP (last 3 results) No results for input(s): PROBNP in the last 8760 hours.  Radiological Exams: Dg Chest Port 1 View  Result Date: 01/25/2019 CLINICAL DATA:  Pneumonia and COPD EXAM: PORTABLE CHEST 1 VIEW COMPARISON:  Two days ago FINDINGS: There is hazy density of the mid and lower chest attributed to layering pleural fluid, which obscures the lower lungs. Cardiopericardial enlargement. No pneumothorax or cephalized blood flow. IMPRESSION: Bilateral pleural effusions that are likely moderate or large. Electronically Signed   By: Marnee SpringJonathon  Watts M.D.   On: 01/25/2019 07:16    Assessment/Plan Active Problems:   Acute on chronic respiratory failure with hypoxia (HCC)   Severe sepsis (HCC)   Pleural effusion, left   Lobar pneumonia, unspecified organism (HCC)   Chronic atrial fibrillation with rapid ventricular response   1. Acute on chronic respiratory failure with hypoxia patient is currently on 5 L of oxygen via nasal cannula satting 98%.  No distress noted at this time.  Continue pulmonary toilet and secretion management 2. Severe sepsis hemodynamically stable 3. Bilateral pleural effusions on chest x-ray continue to monitor 4. Lobar pneumonia treated continue to monitor 5. Chronic atrial fibrillation rate controlled continue  supportive care   I have personally seen and evaluated the patient, evaluated laboratory and imaging results, formulated the assessment and plan and placed orders. The Patient requires high complexity decision making for assessment and support.  Case was discussed on Rounds with the Respiratory Therapy Staff  Yevonne Pax, MD [facility] Pulmonary Critical Care Medicine Sleep Medicine

## 2019-01-26 ENCOUNTER — Other Ambulatory Visit (HOSPITAL_COMMUNITY): Payer: Medicare Other

## 2019-01-26 DIAGNOSIS — I482 Chronic atrial fibrillation, unspecified: Secondary | ICD-10-CM | POA: Diagnosis not present

## 2019-01-26 DIAGNOSIS — J449 Chronic obstructive pulmonary disease, unspecified: Secondary | ICD-10-CM | POA: Diagnosis not present

## 2019-01-26 DIAGNOSIS — J9621 Acute and chronic respiratory failure with hypoxia: Secondary | ICD-10-CM | POA: Diagnosis not present

## 2019-01-26 DIAGNOSIS — J181 Lobar pneumonia, unspecified organism: Secondary | ICD-10-CM | POA: Diagnosis not present

## 2019-01-26 LAB — PROTEIN, PLEURAL OR PERITONEAL FLUID: Total protein, fluid: 4.2 g/dL

## 2019-01-26 LAB — CBC
HCT: 40.6 % (ref 36.0–46.0)
Hemoglobin: 13 g/dL (ref 12.0–15.0)
MCH: 31.6 pg (ref 26.0–34.0)
MCHC: 32 g/dL (ref 30.0–36.0)
MCV: 98.5 fL (ref 80.0–100.0)
Platelets: 340 10*3/uL (ref 150–400)
RBC: 4.12 MIL/uL (ref 3.87–5.11)
RDW: 14.5 % (ref 11.5–15.5)
WBC: 15.8 10*3/uL — ABNORMAL HIGH (ref 4.0–10.5)
nRBC: 0 % (ref 0.0–0.2)

## 2019-01-26 LAB — GLUCOSE, PLEURAL OR PERITONEAL FLUID: Glucose, Fluid: 138 mg/dL

## 2019-01-26 LAB — BODY FLUID CELL COUNT WITH DIFFERENTIAL
Eos, Fluid: 0 %
Lymphs, Fluid: 4 %
Monocyte-Macrophage-Serous Fluid: 11 % — ABNORMAL LOW (ref 50–90)
Neutrophil Count, Fluid: 85 % — ABNORMAL HIGH (ref 0–25)
Total Nucleated Cell Count, Fluid: 250 cu mm (ref 0–1000)

## 2019-01-26 LAB — RENAL FUNCTION PANEL
Albumin: 2.2 g/dL — ABNORMAL LOW (ref 3.5–5.0)
Anion gap: 8 (ref 5–15)
BUN: 17 mg/dL (ref 6–20)
CO2: 35 mmol/L — ABNORMAL HIGH (ref 22–32)
Calcium: 8.9 mg/dL (ref 8.9–10.3)
Chloride: 100 mmol/L (ref 98–111)
Creatinine, Ser: 0.5 mg/dL (ref 0.44–1.00)
GFR calc Af Amer: >60 mL/min (ref >60)
GFR calc non Af Amer: >60 mL/min (ref >60)
Glucose, Bld: 137 mg/dL — ABNORMAL HIGH (ref 70–99)
Phosphorus: 3.8 mg/dL (ref 2.5–4.6)
Potassium: 3.5 mmol/L (ref 3.5–5.1)
Sodium: 143 mmol/L (ref 135–145)

## 2019-01-26 LAB — GRAM STAIN

## 2019-01-26 LAB — MAGNESIUM: Magnesium: 1.7 mg/dL (ref 1.7–2.4)

## 2019-01-26 MED ORDER — LIDOCAINE HCL (PF) 1 % IJ SOLN
INTRAMUSCULAR | Status: AC
  Filled 2019-01-26: qty 30

## 2019-01-26 NOTE — Procedures (Addendum)
PROCEDURE SUMMARY:  Successful image-guided right thoracentesis. Yielded 900 milliliters of hazy amber fluid. Patient tolerated procedure well. No immediate complications. EBL = 5 mL.  Specimen was sent for labs. CXR ordered.  Elwin Mocha PA-C 01/26/2019 12:40 PM

## 2019-01-26 NOTE — Progress Notes (Addendum)
Pulmonary Critical Care Medicine [facility] [place]   PULMONARY CRITICAL CARE SERVICE  PROGRESS NOTE  Date of Service: 01/26/2019  Christine Dunlap  [id number]  DOB: 1955-11-11   DOA: 01/20/2019  Referring Physician: Carron Curie, MD  HPI: Christine Dunlap is a 63 y.o. female seen for follow up of Acute on Chronic Respiratory Failure.  Staff reports patient is more cooperative today and has been taking medications.  Had thoracentesis today and 900 cc was drained out.  Currently on 10 L Oxymizer and satting in the mid 90s.  Medications: Reviewed on Rounds  Physical Exam:  Vitals: Pulse 91 respirations 20 BP 110/64 O2 sat 94% temp 98.4  Ventilator Settings 10 L Oxymizer  . General: Comfortable at this time . Eyes: Grossly normal lids, irises & conjunctiva . ENT: grossly tongue is normal . Neck: no obvious mass . Cardiovascular: S1 S2 normal no gallop . Respiratory: Coarse breath sounds . Abdomen: soft . Skin: no rash seen on limited exam . Musculoskeletal: not rigid . Psychiatric:unable to assess . Neurologic: no seizure no involuntary movements         Lab Data:   Basic Metabolic Panel: Recent Labs  Lab 01/21/19 0439 01/22/19 0500 01/24/19 0326 01/26/19 0704  NA 140 139 141 143  K 4.1 4.3 3.9 3.5  CL 99 99 100 100  CO2 34* 33* 32 35*  GLUCOSE 110* 117* 125* 137*  BUN 15 17 21* 17  CREATININE 0.52 0.55 0.47 0.50  CALCIUM 9.0 8.7* 9.0 8.9  MG 2.0 1.8 1.9 1.7  PHOS 3.2 3.0 3.5 3.8    ABG: No results for input(s): PHART, PCO2ART, PO2ART, HCO3, O2SAT in the last 168 hours.  Liver Function Tests: Recent Labs  Lab 01/21/19 0439 01/26/19 0704  AST 29  --   ALT 31  --   ALKPHOS 61  --   BILITOT 0.8  --   PROT 6.7  --   ALBUMIN 2.1* 2.2*   No results for input(s): LIPASE, AMYLASE in the last 168 hours. No results for input(s): AMMONIA in the last 168 hours.  CBC: Recent Labs  Lab 01/21/19 0439 01/22/19 0500 01/24/19 0326  01/26/19 0704  WBC 10.5 10.0 11.5* 15.8*  NEUTROABS 9.2*  --   --   --   HGB 11.5* 11.7* 11.4* 13.0  HCT 37.7 37.3 35.7* 40.6  MCV 100.5* 99.2 98.6 98.5  PLT 330 340 308 340    Cardiac Enzymes: No results for input(s): CKTOTAL, CKMB, CKMBINDEX, TROPONINI in the last 168 hours.  BNP (last 3 results) No results for input(s): BNP in the last 8760 hours.  ProBNP (last 3 results) No results for input(s): PROBNP in the last 8760 hours.  Radiological Exams: Dg Chest Port 1 View  Result Date: 01/25/2019 CLINICAL DATA:  Pneumonia and COPD EXAM: PORTABLE CHEST 1 VIEW COMPARISON:  Two days ago FINDINGS: There is hazy density of the mid and lower chest attributed to layering pleural fluid, which obscures the lower lungs. Cardiopericardial enlargement. No pneumothorax or cephalized blood flow. IMPRESSION: Bilateral pleural effusions that are likely moderate or large. Electronically Signed   By: Marnee Spring M.D.   On: 01/25/2019 07:16    Assessment/Plan Active Problems:   Acute on chronic respiratory failure with hypoxia (HCC)   Severe sepsis (HCC)   Pleural effusion, left   Lobar pneumonia, unspecified organism (HCC)   Chronic atrial fibrillation with rapid ventricular response   1. Acute on chronic respiratory failure with hypoxia patient is currently  on 10 L of oxygen via Oxymizer.  Satting well with no distress.  Continue pulmonary toilet and secretion management 2. Severe sepsis hemodynamically stable 3. Bilateral pleural effusions on chest x-ray thoracentesis with 900 cc removed today. 4. Lobar pneumonia treated continue to monitor 5. Chronic atrial fibrillation rate controlled continue supportive care   I have personally seen and evaluated the patient, evaluated laboratory and imaging results, formulated the assessment and plan and placed orders. The Patient requires high complexity decision making for assessment and support.  Case was discussed on Rounds with the Respiratory  Therapy Staff  Yevonne PaxSaadat A Cameshia Cressman, MD [facility] Pulmonary Critical Care Medicine Sleep Medicine

## 2019-01-27 ENCOUNTER — Other Ambulatory Visit (HOSPITAL_COMMUNITY): Payer: Medicare Other

## 2019-01-27 DIAGNOSIS — J9621 Acute and chronic respiratory failure with hypoxia: Secondary | ICD-10-CM | POA: Diagnosis not present

## 2019-01-27 DIAGNOSIS — J449 Chronic obstructive pulmonary disease, unspecified: Secondary | ICD-10-CM | POA: Diagnosis not present

## 2019-01-27 DIAGNOSIS — I482 Chronic atrial fibrillation, unspecified: Secondary | ICD-10-CM | POA: Diagnosis not present

## 2019-01-27 DIAGNOSIS — J181 Lobar pneumonia, unspecified organism: Secondary | ICD-10-CM | POA: Diagnosis not present

## 2019-01-27 LAB — MAGNESIUM: Magnesium: 1.9 mg/dL (ref 1.7–2.4)

## 2019-01-27 LAB — BLOOD GAS, ARTERIAL
Acid-Base Excess: 14 mmol/L — ABNORMAL HIGH (ref 0.0–2.0)
Acid-Base Excess: 13.6 mmol/L — ABNORMAL HIGH (ref 0.0–2.0)
Bicarbonate: 38.3 mmol/L — ABNORMAL HIGH (ref 20.0–28.0)
Bicarbonate: 37.8 mmol/L — ABNORMAL HIGH (ref 20.0–28.0)
FIO2: 85
FIO2: 100
O2 Content: 30 L/min
O2 Content: 35 L/min
O2 Saturation: 85.1 %
O2 Saturation: 88.2 %
Patient temperature: 98.6
Patient temperature: 98.6
pCO2 arterial: 48.3 mmHg — ABNORMAL HIGH (ref 32.0–48.0)
pCO2 arterial: 48.2 mmHg — ABNORMAL HIGH (ref 32.0–48.0)
pH, Arterial: 7.51 — ABNORMAL HIGH (ref 7.350–7.450)
pH, Arterial: 7.506 — ABNORMAL HIGH (ref 7.350–7.450)
pO2, Arterial: 49.5 mmHg — ABNORMAL LOW (ref 83.0–108.0)
pO2, Arterial: 54.4 mmHg — ABNORMAL LOW (ref 83.0–108.0)

## 2019-01-27 LAB — CK TOTAL AND CKMB (NOT AT ARMC)
CK, MB: 0.5 ng/mL (ref 0.5–5.0)
Total CK: <5 U/L — ABNORMAL LOW (ref 38–234)

## 2019-01-27 LAB — TROPONIN I: Troponin I: <0.03 ng/mL (ref <0.03)

## 2019-01-27 NOTE — Progress Notes (Addendum)
Pulmonary Critical Care Medicine [facility] [place]   PULMONARY CRITICAL CARE SERVICE  PROGRESS NOTE  Date of Service: 01/27/2019  Christine Dunlap  HEN:[id number]  DOB: May 20, 1956   DOA: 01/20/2019  Referring Physician: Carron Curie, MD  HPI: Christine Dunlap is a 63 y.o. female seen for follow up of Acute on Chronic Respiratory Failure.  Patient currently on high flow nasal cannula 20 L and 80% FiO2.  Currently satting 90 to 91%.  Medications: Reviewed on Rounds  Physical Exam:  Vitals: Pulse 114 respirations 14 BP 116/72 O2 sat 97% temp 97.9  Ventilator Settings heated high flow 20 L 90%  . General: Comfortable at this time . Eyes: Grossly normal lids, irises & conjunctiva . ENT: grossly tongue is normal . Neck: no obvious mass . Cardiovascular: S1 S2 normal no gallop . Respiratory: Coarse breath sounds . Abdomen: soft . Skin: no rash seen on limited exam . Musculoskeletal: not rigid . Psychiatric:unable to assess . Neurologic: no seizure no involuntary movements         Lab Data:   Basic Metabolic Panel: Recent Labs  Lab 01/21/19 0439 01/22/19 0500 01/24/19 0326 01/26/19 0704 01/27/19 0426  NA 140 139 141 143  --   K 4.1 4.3 3.9 3.5  --   CL 99 99 100 100  --   CO2 34* 33* 32 35*  --   GLUCOSE 110* 117* 125* 137*  --   BUN 15 17 21* 17  --   CREATININE 0.52 0.55 0.47 0.50  --   CALCIUM 9.0 8.7* 9.0 8.9  --   MG 2.0 1.8 1.9 1.7 1.9  PHOS 3.2 3.0 3.5 3.8  --     ABG: No results for input(s): PHART, PCO2ART, PO2ART, HCO3, O2SAT in the last 168 hours.  Liver Function Tests: Recent Labs  Lab 01/21/19 0439 01/26/19 0704  AST 29  --   ALT 31  --   ALKPHOS 61  --   BILITOT 0.8  --   PROT 6.7  --   ALBUMIN 2.1* 2.2*   No results for input(s): LIPASE, AMYLASE in the last 168 hours. No results for input(s): AMMONIA in the last 168 hours.  CBC: Recent Labs  Lab 01/21/19 0439 01/22/19 0500 01/24/19 0326 01/26/19 0704  WBC 10.5  10.0 11.5* 15.8*  NEUTROABS 9.2*  --   --   --   HGB 11.5* 11.7* 11.4* 13.0  HCT 37.7 37.3 35.7* 40.6  MCV 100.5* 99.2 98.6 98.5  PLT 330 340 308 340    Cardiac Enzymes: No results for input(s): CKTOTAL, CKMB, CKMBINDEX, TROPONINI in the last 168 hours.  BNP (last 3 results) No results for input(s): BNP in the last 8760 hours.  ProBNP (last 3 results) No results for input(s): PROBNP in the last 8760 hours.  Radiological Exams: Dg Chest Port 1 View  Result Date: 01/27/2019 CLINICAL DATA:  Low oxygen saturation EXAM: PORTABLE CHEST 1 VIEW COMPARISON:  Jan 26, 2019 FINDINGS: The heart size and mediastinal contours are stable. The heart size is enlarged. Moderate left pleural effusion is identified with consolidation of left lung base. Small right pleural effusion is unchanged compared prior exam. No evidence of pulmonary edema. The visualized skeletal structures are stable. IMPRESSION: Moderate left pleural effusion with consolidation of left lung base is unchanged. Small right pleural effusion unchanged. Electronically Signed   By: Sherian Rein M.D.   On: 01/27/2019 16:23   Dg Chest Port 1 View  Result Date: 01/26/2019 CLINICAL DATA:  Right pleural effusion.  Status post thoracentesis. EXAM: PORTABLE CHEST 1 VIEW 1:58 p.m. COMPARISON:  Chest x-rays dated 01/25/2019, 6:14 a.m., 01/23/2019 and 01/20/2019 FINDINGS: There is no pneumothorax after right thoracentesis. There has been marked improvement in the now small right pleural effusion. Left pleural effusion appears slightly larger. Pulmonary vascularity is normal. Cardiac silhouette is obscured by the effusions. IMPRESSION: 1. No pneumothorax after right thoracentesis. 2. Diminished right effusion, now small. 3. Slightly increased moderate left effusion. Electronically Signed   By: Francene BoyersJames  Maxwell M.D.   On: 01/26/2019 14:22   [place] Thoracentesis Asp Pleural Space W/img Guide  Result Date: 01/26/2019 INDICATION: Patient with history of COPD,  acute on chronic respiratory failure with hypoxia, and bilateral pleural effusions. Request is made for diagnostic and therapeutic right thoracentesis. EXAM: ULTRASOUND GUIDED DIAGNOSTIC AND THERAPEUTIC RIGHT THORACENTESIS MEDICATIONS: 10 mL 1% lidocaine COMPLICATIONS: None immediate. PROCEDURE: An ultrasound guided thoracentesis was thoroughly discussed with the patient and questions answered. The benefits, risks, alternatives and complications were also discussed. The patient understands and wishes to proceed with the procedure. Written consent was obtained. Ultrasound was performed to localize and mark an adequate pocket of fluid in the right chest. The area was then prepped and draped in the normal sterile fashion. 1% Lidocaine was used for local anesthesia. Under ultrasound guidance a 6 Fr Safe-T-Centesis catheter was introduced. Thoracentesis was performed. The catheter was removed and a dressing applied. FINDINGS: A total of approximately 900 mL of hazy amber fluid was removed. Samples were sent to the laboratory as requested by the clinical team. IMPRESSION: Successful ultrasound guided right thoracentesis yielding 900 mL of pleural fluid. Read by: Elwin MochaAlexandra Louk, PA-C Electronically Signed   By: Judie PetitM.  Shick M.D.   On: 01/26/2019 14:26    Assessment/Plan Active Problems:   Acute on chronic respiratory failure with hypoxia (HCC)   Severe sepsis (HCC)   Pleural effusion, left   Lobar pneumonia, unspecified organism (HCC)   Chronic atrial fibrillation with rapid ventricular response   1. Acute on chronic respiratory failure with hypoxia currently home peak flow 20 L 90% FiO2 doing well at this at this time and satting the low 90s.  Continue secretion management pulmonary toilet 2. Severe sepsis hemodynamically stable 3. Bilateral pleural effusions on chest thoracentesis yesterday continue monitor 4. Lobar pneumonia treated continue to treat 5. Chronic atrial fibrillation rate controlled continue  supportive care   I have personally seen and evaluated the patient, evaluated laboratory and imaging results, formulated the assessment and plan and placed orders. The Patient requires high complexity decision making for assessment and support.  Case was discussed on Rounds with the Respiratory Therapy Staff  Yevonne PaxSaadat A Lidwina Kaner, MD [facility] Pulmonary Critical Care Medicine Sleep Medicine

## 2019-01-28 ENCOUNTER — Encounter
Disposition: A | Discharge status: Other Acute Inpatient Hospital | Payer: Self-pay | Source: Ambulatory Visit | Attending: Internal Medicine

## 2019-01-28 ENCOUNTER — Inpatient Hospital Stay (HOSPITAL_COMMUNITY)
Admission: AD | Admit: 2019-01-28 | Discharge: 2019-02-23 | DRG: 314 | Disposition: E | Payer: Medicare Other | Source: Other Acute Inpatient Hospital | Attending: Cardiovascular Disease | Admitting: Cardiovascular Disease

## 2019-01-28 ENCOUNTER — Ambulatory Visit (HOSPITAL_COMMUNITY): Payer: Medicare Other

## 2019-01-28 ENCOUNTER — Encounter (HOSPITAL_COMMUNITY): Admission: AD | Disposition: E | Payer: Self-pay | Attending: Cardiovascular Disease

## 2019-01-28 ENCOUNTER — Other Ambulatory Visit (HOSPITAL_COMMUNITY): Payer: Medicare Other

## 2019-01-28 DIAGNOSIS — F319 Bipolar disorder, unspecified: Secondary | ICD-10-CM | POA: Diagnosis present

## 2019-01-28 DIAGNOSIS — J9819 Other pulmonary collapse: Secondary | ICD-10-CM | POA: Diagnosis not present

## 2019-01-28 DIAGNOSIS — J9690 Respiratory failure, unspecified, unspecified whether with hypoxia or hypercapnia: Secondary | ICD-10-CM

## 2019-01-28 DIAGNOSIS — M109 Gout, unspecified: Secondary | ICD-10-CM | POA: Diagnosis present

## 2019-01-28 DIAGNOSIS — J9621 Acute and chronic respiratory failure with hypoxia: Secondary | ICD-10-CM | POA: Diagnosis present

## 2019-01-28 DIAGNOSIS — Z881 Allergy status to other antibiotic agents status: Secondary | ICD-10-CM

## 2019-01-28 DIAGNOSIS — E039 Hypothyroidism, unspecified: Secondary | ICD-10-CM | POA: Diagnosis present

## 2019-01-28 DIAGNOSIS — R0600 Dyspnea, unspecified: Secondary | ICD-10-CM

## 2019-01-28 DIAGNOSIS — Z20828 Contact with and (suspected) exposure to other viral communicable diseases: Secondary | ICD-10-CM | POA: Diagnosis present

## 2019-01-28 DIAGNOSIS — F259 Schizoaffective disorder, unspecified: Secondary | ICD-10-CM | POA: Diagnosis present

## 2019-01-28 DIAGNOSIS — Z66 Do not resuscitate: Secondary | ICD-10-CM | POA: Diagnosis present

## 2019-01-28 DIAGNOSIS — I314 Cardiac tamponade: Secondary | ICD-10-CM | POA: Diagnosis present

## 2019-01-28 DIAGNOSIS — I313 Pericardial effusion (noninflammatory): Secondary | ICD-10-CM | POA: Diagnosis present

## 2019-01-28 DIAGNOSIS — Z9119 Patient's noncompliance with other medical treatment and regimen: Secondary | ICD-10-CM

## 2019-01-28 DIAGNOSIS — J90 Pleural effusion, not elsewhere classified: Secondary | ICD-10-CM | POA: Diagnosis present

## 2019-01-28 DIAGNOSIS — Z79890 Hormone replacement therapy (postmenopausal): Secondary | ICD-10-CM

## 2019-01-28 DIAGNOSIS — T17890A Other foreign object in other parts of respiratory tract causing asphyxiation, initial encounter: Secondary | ICD-10-CM | POA: Diagnosis present

## 2019-01-28 DIAGNOSIS — R410 Disorientation, unspecified: Secondary | ICD-10-CM | POA: Diagnosis not present

## 2019-01-28 DIAGNOSIS — Z79899 Other long term (current) drug therapy: Secondary | ICD-10-CM

## 2019-01-28 DIAGNOSIS — Z7952 Long term (current) use of systemic steroids: Secondary | ICD-10-CM

## 2019-01-28 DIAGNOSIS — D500 Iron deficiency anemia secondary to blood loss (chronic): Secondary | ICD-10-CM | POA: Diagnosis present

## 2019-01-28 DIAGNOSIS — J9601 Acute respiratory failure with hypoxia: Secondary | ICD-10-CM | POA: Diagnosis not present

## 2019-01-28 DIAGNOSIS — F0390 Unspecified dementia without behavioral disturbance: Secondary | ICD-10-CM | POA: Diagnosis present

## 2019-01-28 DIAGNOSIS — Z7951 Long term (current) use of inhaled steroids: Secondary | ICD-10-CM

## 2019-01-28 DIAGNOSIS — I959 Hypotension, unspecified: Secondary | ICD-10-CM | POA: Diagnosis present

## 2019-01-28 DIAGNOSIS — I480 Paroxysmal atrial fibrillation: Secondary | ICD-10-CM | POA: Diagnosis present

## 2019-01-28 DIAGNOSIS — Z7982 Long term (current) use of aspirin: Secondary | ICD-10-CM

## 2019-01-28 DIAGNOSIS — Z87891 Personal history of nicotine dependence: Secondary | ICD-10-CM

## 2019-01-28 DIAGNOSIS — Z880 Allergy status to penicillin: Secondary | ICD-10-CM | POA: Diagnosis not present

## 2019-01-28 DIAGNOSIS — Z79891 Long term (current) use of opiate analgesic: Secondary | ICD-10-CM

## 2019-01-28 DIAGNOSIS — I5032 Chronic diastolic (congestive) heart failure: Secondary | ICD-10-CM | POA: Diagnosis present

## 2019-01-28 DIAGNOSIS — I3139 Other pericardial effusion (noninflammatory): Secondary | ICD-10-CM | POA: Diagnosis present

## 2019-01-28 DIAGNOSIS — J449 Chronic obstructive pulmonary disease, unspecified: Secondary | ICD-10-CM | POA: Diagnosis present

## 2019-01-28 HISTORY — PX: PERICARDIOCENTESIS: CATH118255

## 2019-01-28 LAB — CULTURE, RESPIRATORY W GRAM STAIN: Culture: NORMAL

## 2019-01-28 LAB — BODY FLUID CELL COUNT WITH DIFFERENTIAL
Eos, Fluid: 0 %
Lymphs, Fluid: 26 %
Monocyte-Macrophage-Serous Fluid: 34 % — ABNORMAL LOW (ref 50–90)
Neutrophil Count, Fluid: 40 % — ABNORMAL HIGH (ref 0–25)
Total Nucleated Cell Count, Fluid: 680 cu mm (ref 0–1000)

## 2019-01-28 LAB — BASIC METABOLIC PANEL
Anion gap: 11 (ref 5–15)
BUN: 22 mg/dL — ABNORMAL HIGH (ref 6–20)
CO2: 33 mmol/L — ABNORMAL HIGH (ref 22–32)
Calcium: 9.2 mg/dL (ref 8.9–10.3)
Chloride: 96 mmol/L — ABNORMAL LOW (ref 98–111)
Creatinine, Ser: 0.73 mg/dL (ref 0.44–1.00)
GFR calc Af Amer: >60 mL/min (ref >60)
GFR calc non Af Amer: >60 mL/min (ref >60)
Glucose, Bld: 131 mg/dL — ABNORMAL HIGH (ref 70–99)
Potassium: 4 mmol/L (ref 3.5–5.1)
Sodium: 140 mmol/L (ref 135–145)

## 2019-01-28 LAB — GRAM STAIN

## 2019-01-28 LAB — CBC
HCT: 42.5 % (ref 36.0–46.0)
Hemoglobin: 13.9 g/dL (ref 12.0–15.0)
MCH: 31.9 pg (ref 26.0–34.0)
MCHC: 32.7 g/dL (ref 30.0–36.0)
MCV: 97.5 fL (ref 80.0–100.0)
Platelets: 336 10*3/uL (ref 150–400)
RBC: 4.36 MIL/uL (ref 3.87–5.11)
RDW: 15 % (ref 11.5–15.5)
WBC: 18.2 10*3/uL — ABNORMAL HIGH (ref 4.0–10.5)
nRBC: 0 % (ref 0.0–0.2)

## 2019-01-28 LAB — TROPONIN I: Troponin I: <0.03 ng/mL (ref <0.03)

## 2019-01-28 LAB — CK TOTAL AND CKMB (NOT AT ARMC)
CK, MB: 0.6 ng/mL (ref 0.5–5.0)
Total CK: <5 U/L — ABNORMAL LOW (ref 38–234)

## 2019-01-28 LAB — MRSA PCR SCREENING: MRSA by PCR: NEGATIVE

## 2019-01-28 LAB — SARS CORONAVIRUS 2 BY RT PCR (HOSPITAL ORDER, PERFORMED IN ~~LOC~~ HOSPITAL LAB): SARS Coronavirus 2: NEGATIVE

## 2019-01-28 SURGERY — PERICARDIOCENTESIS
Anesthesia: LOCAL

## 2019-01-28 MED ORDER — IOHEXOL 350 MG/ML SOLN
75.0000 mL | Freq: Once | INTRAVENOUS | Status: AC | PRN
  Administered 2019-01-28: 04:00:00 75 mL via INTRAVENOUS

## 2019-01-28 MED ORDER — FENTANYL CITRATE (PF) 100 MCG/2ML IJ SOLN
INTRAMUSCULAR | Status: DC | PRN
  Administered 2019-01-28: 50 ug via INTRAVENOUS

## 2019-01-28 MED ORDER — LIDOCAINE HCL (PF) 1 % IJ SOLN
INTRAMUSCULAR | Status: DC | PRN
  Administered 2019-01-28: 15 mL

## 2019-01-28 MED ORDER — HEPARIN (PORCINE) IN NACL 1000-0.9 UT/500ML-% IV SOLN
INTRAVENOUS | Status: DC | PRN
  Administered 2019-01-28: 500 mL

## 2019-01-28 MED ORDER — FENTANYL CITRATE (PF) 100 MCG/2ML IJ SOLN
INTRAMUSCULAR | Status: AC
  Filled 2019-01-28: qty 2

## 2019-01-28 MED ORDER — QUETIAPINE FUMARATE ER 200 MG PO TB24
200.0000 mg | ORAL_TABLET | Freq: Every day | ORAL | Status: DC
  Administered 2019-01-28 – 2019-01-31 (×4): 200 mg via ORAL
  Filled 2019-01-28 (×5): qty 1

## 2019-01-28 MED ORDER — ACETAMINOPHEN 325 MG PO TABS
650.0000 mg | ORAL_TABLET | ORAL | Status: DC | PRN
  Filled 2019-01-28: qty 2

## 2019-01-28 MED ORDER — HEPARIN (PORCINE) IN NACL 1000-0.9 UT/500ML-% IV SOLN
INTRAVENOUS | Status: AC
  Filled 2019-01-28: qty 500

## 2019-01-28 MED ORDER — COLCHICINE 0.6 MG PO TABS
0.6000 mg | ORAL_TABLET | Freq: Every day | ORAL | Status: DC
  Administered 2019-01-28 – 2019-01-30 (×3): 0.6 mg via ORAL
  Filled 2019-01-28 (×3): qty 1

## 2019-01-28 MED ORDER — ALBUTEROL SULFATE (2.5 MG/3ML) 0.083% IN NEBU: 3.0000 mL | INHALATION_SOLUTION | Freq: Four times a day (QID) | RESPIRATORY_TRACT | Status: DC | PRN

## 2019-01-28 MED ORDER — SENNA 8.6 MG PO TABS
1.0000 | ORAL_TABLET | Freq: Two times a day (BID) | ORAL | Status: DC
  Administered 2019-01-28 – 2019-01-31 (×6): 8.6 mg via ORAL
  Filled 2019-01-28 (×6): qty 1

## 2019-01-28 MED ORDER — DIVALPROEX SODIUM 500 MG PO DR TAB
500.0000 mg | DELAYED_RELEASE_TABLET | Freq: Three times a day (TID) | ORAL | Status: DC
  Administered 2019-01-28 – 2019-01-31 (×10): 500 mg via ORAL
  Filled 2019-01-28 (×14): qty 1

## 2019-01-28 MED ORDER — SERTRALINE HCL 100 MG PO TABS
100.0000 mg | ORAL_TABLET | Freq: Every day | ORAL | Status: DC
  Administered 2019-01-29 – 2019-01-31 (×3): 100 mg via ORAL
  Filled 2019-01-28 (×3): qty 1

## 2019-01-28 MED ORDER — LIDOCAINE HCL (PF) 1 % IJ SOLN
INTRAMUSCULAR | Status: AC
  Filled 2019-01-28: qty 30

## 2019-01-28 SURGICAL SUPPLY — 2 items
PACK CARDIAC CATHETERIZATION (CUSTOM PROCEDURE TRAY) ×2 IMPLANT
PERIVAC PERICARDIOCENTESIS 8.3 (TRAY / TRAY PROCEDURE) ×2 IMPLANT

## 2019-01-28 NOTE — H&P (Signed)
Referring Physician: Dewanna Porges is an 63 y.o. female.                       Chief Complaint: Hypotension and large pericardial effusion  HPI: 63 year old female has acute on chronic respiratory failure with large pleural effusion. She also has large pericardial fluid without tamponade last week but today she was hypotensive and tachycardic. CT of chest showed large left pleural and large more on left side pericardial fluid. She has PMH of dementia, hypothyroidism, COPD, Atrial fibrillation and pneumonia.   Past Medical History:  Diagnosis Date  . Acute on chronic respiratory failure with hypoxia (HCC)   . Anxiety   . Chronic atrial fibrillation with rapid ventricular response   . COPD (chronic obstructive pulmonary disease) (HCC)   . Dementia (HCC)   . Edema   . Lobar pneumonia, unspecified organism (HCC)   . MDD (major depressive disorder)   . Pleural effusion, left   . Schizoaffective disorder (HCC)   . Severe sepsis (HCC)   . Unspecified lack of coordination       Past Surgical History:  Procedure Laterality Date  . Patient unable to contribute to surgical history due to dementia/schizophrenia      No family history on file. Social History:  reports that she does not drink alcohol or use drugs. No history on file for tobacco.  Allergies:  Allergies  Allergen Reactions  . Levaquin [Levofloxacin In D5w]   . Penicillins     Medications Prior to Admission  Medication Sig Dispense Refill  . acetaminophen (TYLENOL) 325 MG tablet Take 650 mg by mouth every 4 (four) hours as needed for mild pain.    [name] albuterol (PROVENTIL HFA;VENTOLIN HFA) 108 (90 Base) MCG/ACT inhaler Inhale 2 puffs into the lungs every 6 (six) hours as needed for wheezing or shortness of breath.    . calcipotriene (DOVONOX) 0.005 % ointment Apply 1 application topically daily.    . divalproex (DEPAKOTE) 500 MG DR tablet Take 500 mg by mouth 3 (three) times daily.    [name] doxycycline  (VIBRAMYCIN) 100 MG capsule Take 1 capsule (100 mg total) by mouth 2 (two) times daily. 20 capsule 0  . furosemide (LASIX) 20 MG tablet Take 20 mg by mouth daily.    [name] guaifenesin (ROBITUSSIN) 100 MG/5ML syrup Take 200 mg by mouth every 4 (four) hours as needed for cough or congestion.    [name] loratadine (CLARITIN) 10 MG tablet Take 10 mg by mouth daily as needed for allergies.    [name] LORazepam (ATIVAN) 1 MG tablet Take 1 mg by mouth every 8 (eight) hours as needed (emotional outburst).    . LORazepam (ATIVAN) 2 MG/ML concentrated solution Take 0.5 mg by mouth 2 (two) times daily as needed (emotional outburst).    . Multiple Vitamin (MULTIVITAMIN WITH MINERALS) TABS tablet Take 1 tablet by mouth daily.    . QUEtiapine (SEROQUEL XR) 200 MG 24 hr tablet Take 200 mg by mouth at bedtime.    [name] QUEtiapine (SEROQUEL XR) 400 MG 24 hr tablet Take 400 mg by mouth at bedtime.    . senna (SENOKOT) 8.6 MG TABS tablet Take 1 tablet by mouth 2 (two) times daily.    . sertraline (ZOLOFT) 100 MG tablet Take 100 mg by mouth daily.      Results for orders placed or performed during the hospital encounter of 01/20/19 (from the past 48 hour(s))  Magnesium  Status: None   Collection Time: 01/27/19  4:26 AM  Result Value Ref Range   Magnesium 1.9 1.7 - 2.4 mg/dL    Comment: Performed at [facility], [address] [place], [place] [address]  Blood gas, arterial     Status: Abnormal   Collection Time: 01/27/19  5:35 PM  Result Value Ref Range   FIO2 85.00    O2 Content 30.0 L/min   Mode NASAL CANNULA    pH, Arterial 7.510 (H) 7.350 - 7.450   pCO2 arterial 48.3 (H) 32.0 - 48.0 mmHg   pO2, Arterial 49.5 (L) 83.0 - 108.0 mmHg   Bicarbonate 38.3 (H) 20.0 - 28.0 mmol/L   Acid-Base Excess 14.0 (H) 0.0 - 2.0 mmol/L   O2 Saturation 85.1 %   Patient temperature 98.6    Collection site RIGHT RADIAL    Drawn by COLLECTED BY RT    Sample type ARTERIAL DRAW    Allens test (pass/fail) PASS PASS  CK total and  CKMB (cardiac)not at [facility]     Status: Abnormal   Collection Time: 01/27/19  7:20 PM  Result Value Ref Range   Total CK <5 (L) 38 - 234 U/L    Comment: REPEATED TO VERIFY   CK, MB 0.5 0.5 - 5.0 ng/mL   Relative Index NOT CALCULATED 0.0 - 2.5    Comment: Performed at [facility], [address] [place], [place] [address]  Troponin I - Now Then Q6H     Status: None   Collection Time: 01/27/19  7:20 PM  Result Value Ref Range   Troponin I <0.03 <0.03 ng/mL    Comment: Performed at [facility], [address] [place], [place] [address]  Blood gas, arterial     Status: Abnormal   Collection Time: 01/27/19  9:20 PM  Result Value Ref Range   FIO2 100.00    O2 Content 35.0 L/min   Delivery systems HEATED NASAL CANNULA    pH, Arterial 7.506 (H) 7.350 - 7.450   pCO2 arterial 48.2 (H) 32.0 - 48.0 mmHg   pO2, Arterial 54.4 (L) 83.0 - 108.0 mmHg   Bicarbonate 37.8 (H) 20.0 - 28.0 mmol/L   Acid-Base Excess 13.6 (H) 0.0 - 2.0 mmol/L   O2 Saturation 88.2 %   Patient temperature 98.6    Collection site RIGHT BRACHIAL    Drawn by COLLECTED BY RT    Sample type ARTERIAL DRAW   CK total and CKMB (cardiac)not at [facility]     Status: Abnormal   Collection Time: 02/22/2019  6:12 AM  Result Value Ref Range   Total CK <5 (L) 38 - 234 U/L    Comment: RESULT REPEATED AND VERIFIED   CK, MB 0.6 0.5 - 5.0 ng/mL   Relative Index NOT CALCULATED 0.0 - 2.5    Comment: Performed at [facility], [address] [place], [place] [address]  Troponin I - Once     Status: None   Collection Time: 02/10/2019  6:12 AM  Result Value Ref Range   Troponin I <0.03 <0.03 ng/mL    Comment: Performed at [facility], [address] [place], [place] [address]  Basic metabolic panel     Status: Abnormal   Collection Time: 01/25/2019  6:12 AM  Result Value Ref Range   Sodium 140 135 - 145 mmol/L   Potassium 4.0 3.5 - 5.1 mmol/L   Chloride 96 (L) 98 - 111 mmol/L  CO2 33 (H) 22 - 32 mmol/L   Glucose,  Bld 131 (H) 70 - 99 mg/dL   BUN 22 (H) 6 - 20 mg/dL   Creatinine, Ser 1.47 0.44 - 1.00 mg/dL   Calcium 9.2 8.9 - 82.9 mg/dL   GFR calc non Af Amer >60 >60 mL/min   GFR calc Af Amer >60 >60 mL/min   Anion gap 11 5 - 15    Comment: Performed at [facility], [address], [place], [place] [address]  CBC     Status: Abnormal   Collection Time: Feb 04, 2019  6:25 AM  Result Value Ref Range   WBC 18.2 (H) 4.0 - 10.5 K/uL   RBC 4.36 3.87 - 5.11 MIL/uL   Hemoglobin 13.9 12.0 - 15.0 g/dL   HCT 08.6 57.8 - 46.9 %   MCV 97.5 80.0 - 100.0 fL   MCH 31.9 26.0 - 34.0 pg   MCHC 32.7 30.0 - 36.0 g/dL   RDW 62.9 52.8 - 41.3 %   Platelets 336 150 - 400 K/uL   nRBC 0.0 0.0 - 0.2 %    Comment: Performed at [facility], [address], [place], [place] [address]   Ct Angio Chest Pe W Or Wo Contrast  Result Date: 2019/02/04 CLINICAL DATA:  63 y/o F; acute on chronic respiratory failure. PE suspected. EXAM: CT ANGIOGRAPHY CHEST WITH CONTRAST TECHNIQUE: Multidetector CT imaging of the chest was performed using the standard protocol during bolus administration of intravenous contrast. Multiplanar CT image reconstructions and MIPs were obtained to evaluate the vascular anatomy. CONTRAST:  75mL OMNIPAQUE IOHEXOL 350 MG/ML SOLN COMPARISON:  01/27/2019 chest radiograph FINDINGS: Cardiovascular: Satisfactory opacification of the pulmonary arteries to the segmental level. No evidence of pulmonary embolism. Normal heart size. Very large pericardial effusion. Mediastinum/Nodes: No enlarged mediastinal, hilar, or axillary lymph nodes. Thyroid gland, trachea, and esophagus demonstrate no significant findings. Lungs/Pleura: Moderate bilateral pleural effusions. Ground-glass opacities with intralobular septal thickening present within the lungs bilaterally. Dependent atelectasis of the left-greater-than-right lower lobes. No pneumothorax. Upper Abdomen: No acute abnormality. Musculoskeletal: Chronic fracture deformity  of the right proximal humerus. No acute osseous abnormality identified. Review of the MIP images confirms the above findings. IMPRESSION: 1. No evidence of pulmonary embolus. 2. Very large pericardial effusion. 3. Moderate bilateral pleural effusions with dependent atelectasis of lower lobes. 4. Ground-glass opacities and areas of intralobular septal thickening "crazy paving" which can be seen with atypical pneumonia including viral pneumonia, pulmonary hemorrhage, organizing pneumonia as well as additional non infectious etiology. Electronically Signed   By: Mitzi Hansen M.D.   On: 2019/02/04 04:06   Dg Chest Port 1 View  Result Date: 02/04/2019 CLINICAL DATA:  Pleural effusion EXAM: PORTABLE CHEST 1 VIEW COMPARISON:  Earlier today FINDINGS: Cardiopericardial enlargement, pleural fluid by CT. There is bilateral pleural effusion and lobar collapse/consolidation. Cephalized blood flow. 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 IMPRESSION: Pleural effusions and large pericardial effusion with bilateral lower lobe collapse. No change from CT earlier today. Electronically Signed   By: Marnee Spring M.D.   On: 04-Feb-2019 06:49   Dg Chest Port 1 View  Result Date: 01/27/2019 CLINICAL DATA:  Low oxygen saturation EXAM: PORTABLE CHEST 1 VIEW COMPARISON:  Jan 26, 2019 FINDINGS: The heart size and mediastinal contours are stable. The heart size is enlarged. Moderate left pleural effusion is identified with consolidation of left lung base. Small right pleural effusion is unchanged compared prior exam. No evidence of pulmonary  edema. The visualized skeletal structures are stable. IMPRESSION: Moderate left pleural effusion with consolidation of left lung base is unchanged. Small right pleural effusion unchanged. Electronically Signed   By: Sherian Rein M.D.   On: 01/27/2019 16:23    Review Of Systems Unable to obtain except as in PMH.   Blood pressure 101/74, pulse (!)  140, resp. rate 17, SpO2 91 %. There is no height or weight on file to calculate BMI. General appearance: alert, cooperative, appears stated age and no distress Head: Normocephalic, atraumatic. Eyes: Brown eyes, pale conjunctiva, corneas clear.  Neck: No adenopathy, no carotid bruit, no JVD, supple, symmetrical, trachea midline and thyroid not enlarged. Resp: Clearing eith basal dullness to auscultation bilaterally. Cardio: Irregular rate and rhythm, S1, S2 normal, II/VI systolic murmur, no click, rub or gallop GI: Soft, non-tender; bowel sounds normal; no organomegaly. Extremities: No edema, cyanosis or clubbing. Skin: Warm and dry.  Neurologic: Alert and oriented X 0, normal strength.  Assessment/Plan Acute on chronic pericardial effusion with tamponade Large left pleural effusion Chronic atrial fibrillation COPD Hypothyroidism Dementia  825 cc of amber color fluid removed by pericardiocentesis. Patient tolerated the procedure well. Line sutured, labs ordered for fluid and suction applied. Limited echocardiogram showed most of the pericardial fluid removed.  Ricki Rodriguez, MD  02/05/2019, 3:29 PM

## 2019-01-28 NOTE — Consult Note (Signed)
Ref: Patient, No Pcp Per   Subjective:  Hypotensive and tachycardic. CT chest with large pericardial effusion and large left pleural effusion. BP improves post hydration with 250 cc saline infusion.  Objective:  Vital Signs in the last 24 hours:    Physical Exam: BP Readings from Last 1 Encounters:  01/26/19 (!) 126/57     Wt Readings from Last 1 Encounters:  No data found for Wt    Weight change:  There is no height or weight on file to calculate BMI. HEENT: [place]/AT, Eyes-Brown, Conjunctiva-Pink, Sclera-Non-icteric Neck: + JVD, No bruit, Trachea midline. Lungs:  Clearing with dullness on left side. Cardiac:  Tachycardia, Irregular rhythm, normal S1 and S2, no S3. II/VI systolic murmur. Abdomen:  Soft, non-tender. BS present. Extremities:  No edema present. No cyanosis. No clubbing. CNS: AxOx1, Cranial nerves grossly intact.  Skin: Warm and dry.   Intake/Output from previous day: No intake/output data recorded.    Lab Results: BMET    Component Value Date/Time   NA 140 02/08/2019 0612   NA 143 01/26/2019 0704   NA 141 01/24/2019 0326   K 4.0 02/22/2019 0612   K 3.5 01/26/2019 0704   K 3.9 01/24/2019 0326   CL 96 (L) 02/20/2019 0612   CL 100 01/26/2019 0704   CL 100 01/24/2019 0326   CO2 33 (H) 02/18/2019 0612   CO2 35 (H) 01/26/2019 0704   CO2 32 01/24/2019 0326   GLUCOSE 131 (H) 02/15/2019 0612   GLUCOSE 137 (H) 01/26/2019 0704   GLUCOSE 125 (H) 01/24/2019 0326   BUN 22 (H) 02/15/2019 0612   BUN 17 01/26/2019 0704   BUN 21 (H) 01/24/2019 0326   CREATININE 0.73 02/17/2019 0612   CREATININE 0.50 01/26/2019 0704   CREATININE 0.47 01/24/2019 0326   CALCIUM 9.2 02/03/2019 0612   CALCIUM 8.9 01/26/2019 0704   CALCIUM 9.0 01/24/2019 0326   GFRNONAA >60 02/21/2019 0612   GFRNONAA >60 01/26/2019 0704   GFRNONAA >60 01/24/2019 0326   GFRAA >60 02/09/2019 0612   GFRAA >60 01/26/2019 0704   GFRAA >60 01/24/2019 0326   CBC    Component Value Date/Time   WBC  18.2 (H) 02/03/2019 0625   RBC 4.36 01/30/2019 0625   HGB 13.9 02/14/2019 0625   HCT 42.5 01/25/2019 0625   PLT 336 02/04/2019 0625   MCV 97.5 01/30/2019 0625   MCH 31.9 02/20/2019 0625   MCHC 32.7 02/19/2019 0625   RDW 15.0 02/15/2019 0625   LYMPHSABS 0.8 01/21/2019 0439   MONOABS 0.3 01/21/2019 0439   EOSABS 0.0 01/21/2019 0439   BASOSABS 0.0 01/21/2019 0439   HEPATIC Function Panel Recent Labs    01/21/19 0439  PROT 6.7   HEMOGLOBIN A1C No components found for: HGA1C,  MPG CARDIAC ENZYMES Lab Results  Component Value Date   CKTOTAL <5 (L) 02/09/2019   CKMB 0.6 02/18/2019   TROPONINI <0.03 01/24/2019   TROPONINI <0.03 01/27/2019   BNP No results for input(s): PROBNP in the last 8760 hours. TSH Recent Labs    01/21/19 0429  TSH 1.397   CHOLESTEROL No results for input(s): CHOL in the last 8760 hours.  Scheduled Meds: Continuous Infusions: PRN Meds:.  Assessment/Plan: Large pericardial effusion without tamponade Large left pleural effusion Chronic atrial fibrillation COPD Hypothyroidism Dementia  Pericardiocentesis. Obtained permission for procedure and explained risks involved.   LOS: 0 days    Orpah Cobb  MD  02/19/2019, 9:34 AM

## 2019-01-28 NOTE — Progress Notes (Signed)
  Echocardiogram 2D Echocardiogram limited has been performed.  Gerda Diss 02/19/2019, 4:04 PM

## 2019-01-29 ENCOUNTER — Inpatient Hospital Stay (HOSPITAL_COMMUNITY): Payer: Medicare Other

## 2019-01-29 ENCOUNTER — Encounter (HOSPITAL_COMMUNITY): Payer: Self-pay | Admitting: Cardiovascular Disease

## 2019-01-29 DIAGNOSIS — J9601 Acute respiratory failure with hypoxia: Secondary | ICD-10-CM

## 2019-01-29 LAB — BASIC METABOLIC PANEL
Anion gap: 11 (ref 5–15)
BUN: 27 mg/dL — ABNORMAL HIGH (ref 6–20)
CO2: 34 mmol/L — ABNORMAL HIGH (ref 22–32)
Calcium: 8.8 mg/dL — ABNORMAL LOW (ref 8.9–10.3)
Chloride: 96 mmol/L — ABNORMAL LOW (ref 98–111)
Creatinine, Ser: 0.51 mg/dL (ref 0.44–1.00)
GFR calc Af Amer: >60 mL/min (ref >60)
GFR calc non Af Amer: >60 mL/min (ref >60)
Glucose, Bld: 102 mg/dL — ABNORMAL HIGH (ref 70–99)
Potassium: 3.9 mmol/L (ref 3.5–5.1)
Sodium: 141 mmol/L (ref 135–145)

## 2019-01-29 LAB — LD, BODY FLUID (OTHER): LD, Body Fluid: 126 IU/L

## 2019-01-29 LAB — GLUCOSE, BODY FLUID OTHER: Glucose, Body Fluid Other: 118 mg/dL

## 2019-01-29 LAB — CBC
HCT: 36.3 % (ref 36.0–46.0)
Hemoglobin: 11.7 g/dL — ABNORMAL LOW (ref 12.0–15.0)
MCH: 31.7 pg (ref 26.0–34.0)
MCHC: 32.2 g/dL (ref 30.0–36.0)
MCV: 98.4 fL (ref 80.0–100.0)
Platelets: 246 10*3/uL (ref 150–400)
RBC: 3.69 MIL/uL — ABNORMAL LOW (ref 3.87–5.11)
RDW: 15.1 % (ref 11.5–15.5)
WBC: 9.9 10*3/uL (ref 4.0–10.5)
nRBC: 0 % (ref 0.0–0.2)

## 2019-01-29 LAB — GLUCOSE, BODY FLUID OTHER??????????

## 2019-01-29 LAB — ECHOCARDIOGRAM LIMITED: Weight: 2931.24 oz

## 2019-01-29 LAB — HIV ANTIBODY (ROUTINE TESTING W REFLEX): HIV Screen 4th Generation wRfx: NONREACTIVE

## 2019-01-29 MED ORDER — DILTIAZEM HCL-DEXTROSE 100-5 MG/100ML-% IV SOLN (PREMIX)
10.0000 mg/h | INTRAVENOUS | Status: DC
  Administered 2019-01-29 (×2): 10 mg/h via INTRAVENOUS
  Administered 2019-01-30: 5 mg/h via INTRAVENOUS
  Filled 2019-01-29 (×3): qty 100

## 2019-01-29 NOTE — Progress Notes (Signed)
Ref: Patient, No Pcp Per   Subjective:  Resting comfortably with oxygen by NRB mask, hanging loose but maintaining oxygen saturation at 92 + %. Covid test is negative. CCM consulted for large pleural effusion.  Pericardial drain is very little. Awaiting limited echocardiogram. HR improving post diltiazem drip at 5-10 mg/hr. Afebrile. Not on anticoagulation for possible thoracentesis, low Hgb and slightly hemorrhagic pericardial fluid.  Objective:  Vital Signs in the last 24 hours: Temp:  [97.8 F (36.6 C)] 97.8 F (36.6 C) (05/07 0348) Pulse Rate:  [29-177] 76 (05/07 0852) Cardiac Rhythm: Atrial fibrillation (05/07 0740) Resp:  [10-32] 18 (05/07 0852) BP: (84-134)/(59-116) 94/76 (05/07 0852) SpO2:  [85 %-96 %] 94 % (05/07 0852) Weight:  [83.1 kg] 83.1 kg (05/07 4888)  Physical Exam: BP Readings from Last 1 Encounters:  01/29/19 94/76     Wt Readings from Last 1 Encounters:  01/29/19 83.1 kg    Weight change:  There is no height or weight on file to calculate BMI. HEENT: [place]/AT, Eyes-Brown, Conjunctiva-Pink, Sclera-Non-icteric Neck: No JVD, No bruit, Trachea midline. Lungs:  Clearing, Bilateral. Cardiac:  Irregular rhythm, normal S1 and S2, no S3. II/VI systolic murmur. Abdomen:  Soft, non-tender. BS present. Extremities:  No edema present. No cyanosis. No clubbing. CNS: AxOx1, Cranial nerves grossly intact, moves all 4 extremities.  Skin: Warm and dry.   Intake/Output from previous day: 05/06 0701 - 05/07 0700 In: -  Out: 300 [Urine:300]    Lab Results: BMET    Component Value Date/Time   NA 141 01/29/2019 0237   NA 140 02/18/2019 0612   NA 143 01/26/2019 0704   K 3.9 01/29/2019 0237   K 4.0 02/13/2019 0612   K 3.5 01/26/2019 0704   CL 96 (L) 01/29/2019 0237   CL 96 (L) 02/07/2019 0612   CL 100 01/26/2019 0704   CO2 34 (H) 01/29/2019 0237   CO2 33 (H) 01/31/2019 0612   CO2 35 (H) 01/26/2019 0704   GLUCOSE 102 (H) 01/29/2019 0237   GLUCOSE 131 (H)  01/24/2019 0612   GLUCOSE 137 (H) 01/26/2019 0704   BUN 27 (H) 01/29/2019 0237   BUN 22 (H) 01/31/2019 0612   BUN 17 01/26/2019 0704   CREATININE 0.51 01/29/2019 0237   CREATININE 0.73 02/07/2019 0612   CREATININE 0.50 01/26/2019 0704   CALCIUM 8.8 (L) 01/29/2019 0237   CALCIUM 9.2 01/26/2019 0612   CALCIUM 8.9 01/26/2019 0704   GFRNONAA >60 01/29/2019 0237   GFRNONAA >60 01/25/2019 0612   GFRNONAA >60 01/26/2019 0704   GFRAA >60 01/29/2019 0237   GFRAA >60 02/10/2019 0612   GFRAA >60 01/26/2019 0704   CBC    Component Value Date/Time   WBC 9.9 01/29/2019 0237   RBC 3.69 (L) 01/29/2019 0237   HGB 11.7 (L) 01/29/2019 0237   HCT 36.3 01/29/2019 0237   PLT 246 01/29/2019 0237   MCV 98.4 01/29/2019 0237   MCH 31.7 01/29/2019 0237   MCHC 32.2 01/29/2019 0237   RDW 15.1 01/29/2019 0237   LYMPHSABS 0.8 01/21/2019 0439   MONOABS 0.3 01/21/2019 0439   EOSABS 0.0 01/21/2019 0439   BASOSABS 0.0 01/21/2019 0439   HEPATIC Function Panel Recent Labs    01/21/19 0439  PROT 6.7   HEMOGLOBIN A1C No components found for: HGA1C,  MPG CARDIAC ENZYMES Lab Results  Component Value Date   CKTOTAL <5 (L) 02/05/2019   CKMB 0.6 02/16/2019   TROPONINI <0.03 01/27/2019   TROPONINI <0.03 01/27/2019   BNP  No results for input(s): PROBNP in the last 8760 hours. TSH Recent Labs    01/21/19 0429  TSH 1.397   CHOLESTEROL No results for input(s): CHOL in the last 8760 hours.  Scheduled Meds: . colchicine  0.6 mg Oral Daily  . divalproex  500 mg Oral TID  . QUEtiapine  200 mg Oral QHS  . senna  1 tablet Oral BID  . sertraline  100 mg Oral Daily   Continuous Infusions: . diltiazem (CARDIZEM) infusion 10 mg/hr (01/29/19 0809)   PRN Meds:.acetaminophen, albuterol  Assessment/Plan: Acute on chronic pericardial effusion, currently stable Large left pleural effusion Chronic respiratory failure with hypoxemia Chronic atrial fibrillation, not on  anticoagulation COPD Hypothyroidism Dementia Iron deficiency and blood loss anemia  Awaiting echocardiogram. Appreciate CCM consult Continue diltiazem Consider small dose metoprolol for HR control.   LOS: 1 day    Orpah CobbAjay Jasiel Belisle  MD  01/29/2019, 9:42 AM

## 2019-01-29 NOTE — Progress Notes (Signed)
Cardiologist Dr. Lenard Lance was notified of the patient's heart rate substaining in the 140s-150s while in afib with rvr. Ordered received to start the patient on a Cardizem drip.

## 2019-01-29 NOTE — Progress Notes (Signed)
Echocardiogram 2D Echocardiogram has been performed.  Pieter Partridge 01/29/2019, 11:30 AM

## 2019-01-29 NOTE — Consult Note (Addendum)
PULMONARY / CRITICAL CARE MEDICINE   NAME:  Christine Dunlap, MRN:  [id number], DOB:  1956/07/15, LOS: 1 ADMISSION DATE:  02/03/2019, CONSULTATION DATE: 01/29/2019 REFERRING MD: Cardiology, CHIEF COMPLAINT: Hypoxia  BRIEF HISTORY:    63 year old demented patient [facility] with reported sepsis from pneumonia. HISTORY OF PRESENT ILLNESS   63 year old female who [facility].  Reportedly has acute on chronic respiratory failure is felt to have sepsis from questionable pneumonia.  She was hemodynamically stable.  She was found to have bilateral pleural effusions and underwent interventional radiology tap of the right pleural effusion for 900 cc on 01/26/2019.  Results CT scan revealed pericardial effusion she was [facility]/2019 by cardiology underwent paracentesis with 820 cc of fluid obtained.  He does have a past medical history significant for chronic structural pulmonary disease.  [facility] she was transition to a DNR.  On 01/29/2019 pulmonary critical care was consulted for hypoxia.  Note she is being followed by [facility] is actually less oxygen now than [facility].  She does desaturate with any type of activity or taking her facemask off.  Currently she is on approximately 6% FiO2 via nonrebreather.  There is a question whether not she can undergo thoracentesis again as there is significant fluid noted x-rays. SIGNIFICANT PAST MEDICAL HISTORY    acute on chronic respiratory failure Advanced dementia Schizoaffective disorder Hypothyroidism Advanced COPD Gout  SIGNIFICANT EVENTS:  01/27/2019 [facility] due to large pericardial effusion status post pericardiocentesis STUDIES:    CULTURES:  02/15/2019 pericardiocentesis  fluid>> 01/26/2019 pleural fluid>> ANTIBIOTICS:  01/29/2019 not currently on antimicrobial therapy  LINES/TUBES:   01/27/2019 pericardiocentesis drain placed by cardiology CONSULTANTS:  01/29/2019 pulmonary critical care SUBJECTIVE:  Frail female who is in no acute distress able to eat and phonate without difficulty  CONSTITUTIONAL: BP 110/65   Pulse 96   Temp 97.8 F (36.6 C) (Axillary)   Resp 18   Wt 83.1 kg   SpO2 91%   I/O last 3 completed shifts: In: -  Out: 300 [Urine:300]        PHYSICAL EXAM: General: Frail  disheveled female Neuro: Follows simple commands.  Orientated to self but not able to answer any pertinent questions concerning her health HEENT: Edentulous, no JVD lymphadenopathy is appreciated Cardiovascular: Heart sounds are regular irregular current tachycardia with atrial fibrillation 110, pericardial drain is noted at the xiphoid process. Lungs: Decreased breath sounds throughout Abdomen: Obese soft positive bowel sounds Musculoskeletal: Intact Skin: 1+  RESOLVED PROBLEM LIST   ASSESSMENT AND PLAN   Acute on chronic respiratory failure in the setting of bilateral pleural effusions status post right thoracentesis for 900 cc, pericardial effusion status post pericardiocentesis for 820 cc, advanced chronic restrictive pulmonary disease.  Former smoker.  [name] secondary to advanced dementia. She is on nonrebreather at approximately 60% but quickly desaturates when off oxygen She is able to eat cough and deep breathing some commands She has been followed [facility].  Dr. Welton FlakesKhan. Titrate oxygen to keep sats greater than 88% Pulmonary toilet Evaluate for repeat thoracentesis Diuresis as tolerated Repeat ABG and to validate O2 saturations Follow thoracentesis and pericardiocentesis fluid results Note she is a DNR.  Status post pericardiocentesis with 820 cc  removed Atrial  fibrillation with intermittent rapid response on diltiazem drip Per cardiology  Advanced dementia Schizoaffective disorder Manic depressive disorder Per primary Continue medications as needed  History of recent sepsis which seems to be resolved at this time. Monitor hemodynamic status   SUMMARY OF TODAY'S PLAN:  63 year old demented patient evaluated for hypoxia.Marland Dunlap  Best Practice / Goals of Care / Disposition.   DVT PROPHYLAXIS: Compression hose SUP: PPI NUTRITION: Oral diet MOBILITY: Out of bed as tolerated GOALS OF CARE: She is a DNR FAMILY DISCUSSIONS: No family available for discussion DISPOSITION: Intensive care unit, [facility].  [facility]  LABS  Glucose No results for input(s): GLUCAP in the last 168 hours.  BMET Recent Labs  Lab 01/26/19 0704 02/21/2019 0612 01/29/19 0237  NA 143 140 141  K 3.5 4.0 3.9  CL 100 96* 96*  CO2 35* 33* 34*  BUN 17 22* 27*  CREATININE 0.50 0.73 0.51  GLUCOSE 137* 131* 102*    Liver Enzymes Recent Labs  Lab 01/26/19 0704  ALBUMIN 2.2*    Electrolytes Recent Labs  Lab 01/24/19 0326 01/26/19 0704 01/27/19 0426 02/12/2019 0612 01/29/19 0237  CALCIUM 9.0 8.9  --  9.2 8.8*  MG 1.9 1.7 1.9  --   --   PHOS 3.5 3.8  --   --   --     CBC Recent Labs  Lab 01/26/19 0704 02/18/2019 0625 01/29/19 0237  WBC 15.8* 18.2* 9.9  HGB 13.0 13.9 11.7*  HCT 40.6 42.5 36.3  PLT 340 336 246    ABG Recent Labs  Lab 01/27/19 1735 01/27/19 2120  PHART 7.510* 7.506*  PCO2ART 48.3* 48.2*  PO2ART 49.5* 54.4*    Coag's No results for input(s): APTT, INR in the last 168 hours.  Sepsis Markers No results for input(s): LATICACIDVEN, PROCALCITON, O2SATVEN in the last 168 hours.  Cardiac Enzymes Recent Labs  Lab 01/27/19 1920 01/26/2019 0612  TROPONINI <0.03 <0.03    PAST MEDICAL HISTORY :   She  has a past medical history  of Acute on chronic respiratory failure with hypoxia (HCC), Anxiety, Chronic atrial fibrillation with rapid ventricular response, COPD (chronic obstructive pulmonary disease) (HCC), Dementia (HCC), Edema, Lobar pneumonia, unspecified organism (HCC), MDD (major depressive disorder), Pleural effusion, left, Schizoaffective disorder (HCC), Severe sepsis (HCC), and Unspecified lack of coordination.  PAST SURGICAL HISTORY:  She  has a past surgical history that includes Patient unable to contribute to surgical history due to dementia/schizophrenia and PERICARDIOCENTESIS (N/A, 01/25/2019).  Allergies  Allergen Reactions  . Levaquin [Levofloxacin In D5w] Other (See Comments)    Per MAR  . Penicillins Other (See Comments)    Per MAR Did it involve swelling of the face/tongue/throat, SOB, or low BP? Unknown Did it involve sudden or severe rash/hives, skin peeling, or any reaction on the inside of your mouth or nose? Unknown Did you need to seek medical attention at a hospital or doctor's office? Unknown When did it last happen?unk If all above answers are "NO", may proceed with cephalosporin use.     No current facility-administered medications on file prior to encounter.    Current Outpatient Medications on File Prior to Encounter  Medication Sig  . acetaminophen (TYLENOL) 325 MG tablet Take 650 mg by mouth every 6 (six) hours as needed for mild pain.   Marland Dunlap ALPRAZolam (XANAX) 0.5 MG tablet Take 0.5 mg by mouth 2 (two) times  daily as needed for anxiety.  Marland Dunlap aspirin 81 MG chewable tablet Chew 81 mg by mouth daily.  . budesonide (PULMICORT) 0.5 MG/2ML nebulizer solution Take 0.5 mg by nebulization 2 (two) times daily.  . digoxin (LANOXIN) 0.125 MG tablet Take 0.125 mg by mouth daily.  Marland Dunlap doxycycline 100 mg in sodium chloride 0.9 % 250 mL Inject 100 mg into the vein every 12 (twelve) hours.  . famotidine (PEPCID) 20 MG tablet Take 20 mg by mouth 2 (two) times daily.  . furosemide (LASIX) 10 MG/ML  solution Take 40 mg by mouth 2 (two) times daily.  Marland Dunlap HALOPERIDOL DECANOATE IM Inject 2 mg into the muscle every 8 (eight) hours as needed (anxiety or agitation).  . heparin 5000 UNIT/ML injection Inject 5,000 Units into the skin every 8 (eight) hours.  Marland Dunlap ipratropium-albuterol (DUONEB) 0.5-2.5 (3) MG/3ML SOLN Take 3 mLs by nebulization every 6 (six) hours as needed (shortness of breath or wheezing).  Marland Dunlap levothyroxine (SYNTHROID) 100 MCG tablet Take 100 mcg by mouth daily before breakfast.  . magnesium oxide (MAG-OX) 400 MG tablet Take 400 mg by mouth 2 (two) times daily.  . Melatonin 3 MG TABS Take 3 mg by mouth at bedtime.  . methylPREDNISolone sodium succinate (SOLU-MEDROL) 40 mg/mL injection Inject 40 mg into the vein daily.  . metoprolol tartrate (LOPRESSOR) 5 MG/5ML SOLN injection Inject 5 mg into the vein every 6 (six) hours as needed (HR > 120 or  Hold if SBP < 100).  . metoprolol tartrate (LOPRESSOR) 50 MG tablet Take 50 mg by mouth 2 (two) times daily.  . mirtazapine (REMERON) 15 MG tablet Take 15 mg by mouth at bedtime.  Marland Dunlap morphine 2 MG/ML injection Inject 1 mg into the vein every 8 (eight) hours as needed (pain).  . Multiple Vitamin (MULTIVITAMIN WITH MINERALS) TABS tablet Take 1 tablet by mouth daily.  . Multiple Vitamins-Minerals (MULTIVITAMINS THER. W/MINERALS) TABS tablet Take 1 tablet by mouth daily.  . ondansetron (ZOFRAN) 4 MG tablet Take 4 mg by mouth every 6 (six) hours as needed for nausea or vomiting.  . ondansetron (ZOFRAN) 4 MG/5ML solution Take 4 mg by mouth every 6 (six) hours as needed for nausea or vomiting.  Marland Dunlap oxyCODONE (OXY IR/ROXICODONE) 5 MG immediate release tablet Take 5 mg by mouth every 6 (six) hours as needed for severe pain.  . potassium chloride (KLOR-CON) 20 MEQ packet Take 20 mEq by mouth daily.  . Probiotic Product (DIGESTIVE ADVANTAGE PO) Take 1 capsule by mouth daily.  . QUEtiapine (SEROQUEL) 25 MG tablet Take 25 mg by mouth 2 (two) times daily.  .  Revefenacin (YUPELRI) 175 MCG/3ML SOLN Inhale 175 mcg into the lungs daily.  . sertraline (ZOLOFT) 100 MG tablet Take 200 mg by mouth daily.   Marland Dunlap valproic acid (DEPAKENE) 250 MG/5ML SOLN solution Take 500 mg by mouth 2 (two) times a day.  Marland Dunlap doxycycline (VIBRAMYCIN) 100 MG capsule Take 1 capsule (100 mg total) by mouth 2 (two) times daily. (Patient not taking: Reported on 14-Feb-2019)    FAMILY HISTORY:   Her family history is not on file.  SOCIAL HISTORY:  She  reports that she does not drink alcohol or use drugs.  REVIEW OF SYSTEMS:    NA    App cct 45 min   Brett Canales Login [place] ACNP Adolph Pollack PCCM Pager [phone] till 1 pm If no answer page [phone] 01/29/2019, 11:27 AM

## 2019-01-30 ENCOUNTER — Inpatient Hospital Stay (HOSPITAL_COMMUNITY): Payer: Medicare Other

## 2019-01-30 MED ORDER — GUAIFENESIN ER 600 MG PO TB12
1200.0000 mg | ORAL_TABLET | Freq: Every day | ORAL | Status: DC
  Administered 2019-01-30 – 2019-01-31 (×2): 1200 mg via ORAL
  Filled 2019-01-30 (×2): qty 2

## 2019-01-30 MED ORDER — ORAL CARE MOUTH RINSE
15.0000 mL | Freq: Two times a day (BID) | OROMUCOSAL | Status: DC
  Administered 2019-01-30 – 2019-01-31 (×4): 15 mL via OROMUCOSAL

## 2019-01-30 MED ORDER — DEXMEDETOMIDINE HCL IN NACL 200 MCG/50ML IV SOLN
0.4000 ug/kg/h | INTRAVENOUS | Status: DC
  Administered 2019-01-30: 0.3 ug/kg/h via INTRAVENOUS
  Administered 2019-01-30: 03:00:00 0.5 ug/kg/h via INTRAVENOUS
  Filled 2019-01-30 (×2): qty 50

## 2019-01-30 MED ORDER — POTASSIUM CHLORIDE CRYS ER 20 MEQ PO TBCR
20.0000 meq | EXTENDED_RELEASE_TABLET | Freq: Two times a day (BID) | ORAL | Status: DC
  Administered 2019-01-30 – 2019-01-31 (×4): 20 meq via ORAL
  Filled 2019-01-30 (×5): qty 2
  Filled 2019-01-30: qty 1

## 2019-01-30 MED ORDER — METOPROLOL TARTRATE 50 MG PO TABS
50.0000 mg | ORAL_TABLET | Freq: Two times a day (BID) | ORAL | Status: DC
  Administered 2019-01-30 – 2019-01-31 (×2): 50 mg via ORAL
  Filled 2019-01-30 (×2): qty 1

## 2019-01-30 MED ORDER — ALPRAZOLAM 0.5 MG PO TABS
0.5000 mg | ORAL_TABLET | Freq: Two times a day (BID) | ORAL | Status: DC
  Administered 2019-01-30 – 2019-01-31 (×4): 0.5 mg via ORAL
  Filled 2019-01-30 (×4): qty 1

## 2019-01-30 MED ORDER — DEXMEDETOMIDINE HCL IN NACL 200 MCG/50ML IV SOLN
INTRAVENOUS | Status: AC
  Filled 2019-01-30: qty 50

## 2019-01-30 MED ORDER — POTASSIUM CHLORIDE IN NACL 20-0.9 MEQ/L-% IV SOLN
INTRAVENOUS | Status: DC
  Administered 2019-01-30: 18:00:00 via INTRAVENOUS
  Filled 2019-01-30: qty 1000

## 2019-01-30 MED ORDER — FUROSEMIDE 10 MG/ML IJ SOLN
40.0000 mg | Freq: Once | INTRAMUSCULAR | Status: AC
  Administered 2019-01-30: 40 mg via INTRAVENOUS

## 2019-01-30 MED ORDER — MAGNESIUM SULFATE IN D5W 1-5 GM/100ML-% IV SOLN
1.0000 g | Freq: Once | INTRAVENOUS | Status: AC
  Administered 2019-01-30: 1 g via INTRAVENOUS
  Filled 2019-01-30: qty 100

## 2019-01-30 MED ORDER — FUROSEMIDE 10 MG/ML IJ SOLN
40.0000 mg | Freq: Two times a day (BID) | INTRAMUSCULAR | Status: AC
  Administered 2019-01-30 – 2019-01-31 (×2): 40 mg via INTRAVENOUS
  Filled 2019-01-30 (×3): qty 4

## 2019-01-30 NOTE — Progress Notes (Addendum)
PULMONARY / CRITICAL CARE MEDICINE   NAME:  Christine Dunlap, MRN:  [id number], DOB:  [birth year], LOS: 2 ADMISSION DATE:  01/29/2019, CONSULTATION DATE: 01/29/2019 REFERRING MD: Cardiology, CHIEF COMPLAINT: Hypoxia  BRIEF HISTORY:    63 year old demented patient [facility] with reported sepsis from pneumonia. HISTORY OF PRESENT ILLNESS   63 year old female who was [facility] from [facility].  Reportedly has acute on chronic respiratory failure is felt to have sepsis from questionable pneumonia.  She was hemodynamically stable.  She was found to have bilateral pleural effusions and underwent interventional radiology tap of the right pleural effusion for 900 cc on 01/26/2019.  Results CT scan revealed pericardial effusion she was transferred to [facility] 02/16/2019 by cardiology underwent paracentesis with 820 cc of fluid obtained.  He does have a past medical history significant for chronic structural pulmonary disease.  [facility] she was transition to a DNR.  On 01/29/2019 pulmonary critical care was consulted for hypoxia.  Note she is being followed by [facility] is actually less oxygen now than when [facility].  She does desaturate with any type of activity or taking her facemask off.  Currently she is on approximately 6% FiO2 via nonrebreather.  There is a question whether not she can undergo thoracentesis again as there is significant fluid noted x-rays. SIGNIFICANT PAST MEDICAL HISTORY    acute on chronic respiratory failure Advanced dementia Schizoaffective disorder Hypothyroidism Advanced COPD Gout  SIGNIFICANT EVENTS:  01/31/2019 transferred to [facility] due to large pericardial effusion status post pericardiocentesis STUDIES:   01/27/2019>> CTA No evidence of pulmonary  embolus. Very large pericardial effusion. Moderate bilateral pleural effusions with dependent atelectasis of lower lobes. Ground-glass opacities and areas of intralobular septal thickening "crazy paving" which can be seen with atypical pneumonia including viral pneumonia, pulmonary hemorrhage, organizing pneumonia as well as additional non infectious etiology.  02/12/2019 Echo Left Ventricle: The left ventricle has normal systolic function, with an ejection fraction of 60-65%. The cavity size was normal. There is no increase in left ventricular wall thickness.  Right Ventricle: The right ventricle has normal systolic function. The cavity was normal. There is no increase in right ventricular wall thickness. Left Atrium: Left atrial size was normal in size. Right Atrium: Right atrial size was normal in size. Interatrial Septum: No atrial level shunt detected by color flow Doppler. Pericardium: A small pericardial effusion is present. The pericardial effusion is surrounding the apex. There is a large pleural effusion in either the left or right lateral region. Mitral Valve: Mitral valve regurgitation was not assessed by color flow Doppler. Tricuspid Valve: Tricuspid valve regurgitation was not visualized by color flow Doppler.  CULTURES:  01/23/2019 pericardiocentesis fluid>> 01/26/2019 pleural fluid>> GS No Growth: Cx>> ANTIBIOTICS:  01/29/2019 not currently on antimicrobial therapy  LINES/TUBES:   01/27/2019 pericardiocentesis drain placed by cardiology CONSULTANTS:  01/29/2019 pulmonary critical care SUBJECTIVE:  Frail female who is in no acute distress able to eat and phonate without difficulty. She is calm this morning, precedex currently on hold  CONSTITUTIONAL: BP (!) 82/72   Pulse 70   Temp 97.7 F (36.5 C) (Oral)   Resp (!) 21   Wt 83.1 kg   SpO2 (!) 88%   I/O last 3 completed shifts: In: 1524.8 [P.O.:1320; I.V.:204.8] Out: 825 [Urine:825]        PHYSICAL  EXAM: General: Frail  disheveled female, sitting in bed on 8 L HF oxygen Neuro: Follows simple commands.  Orientated to self only, answers some simple questions, MAE x 4 HEENT: NCAT, Edentulous, trace JVD, no lymphadenopathy is appreciated Cardiovascular: S1, S2,  irregular RR, Currently appears to be in NSR, rate of 70, pericardial drain is noted at the xiphoid process, minimal output. Lungs: Bilateral chest excursion, Rhonchi throughout, diminished per bases Abdomen: Obese soft , ND, positive bowel sounds,  Musculoskeletal: Intact, no obvious deformities Skin: Warm and dry, no rash or lesions noted  RESOLVED PROBLEM LIST   ASSESSMENT AND PLAN   Acute on chronic respiratory failure in the setting of bilateral pleural effusions status post right thoracentesis for 900 cc, pericardial effusion status post pericardiocentesis for 820 cc, advanced chronic restrictive pulmonary disease.  Former smoker.  Current [facility] secondary to advanced dementia. She has been weaned to 8 L HF [place], sats are 92% She is able to eat, cough and deep breathing on command She has been followed by pulmonary [facility] on 01/[facility].  Dr. Welton Flakes. Titrate oxygen to keep sats greater than 88% Aggressive Pulmonary toilet IS Q 1 while awake Will add Mucinex as mucolytic to see if she can better cough up secretions Consider repeat thoracentesis Consider diuresis>> BP is currently soft on Cardizem gtt ABG prn CXR in am 5/9 and prn Follow thoracentesis and pericardiocentesis fluid results Note she is a DNR.  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 Monitor hemodynamic status   SUMMARY OF TODAY'S PLAN:  63 year old demented patient evaluated for hypoxia.. Weaned to 8 L HFNC over night  Sats currently > 92% Started on precedex over night for worsening delirium vs sundowner syndrome Required NTS without significant amount of secretions yielded  Best Practice / Goals of Care / Disposition.   DVT PROPHYLAXIS: Compression hose SUP: PPI NUTRITION: Oral diet MOBILITY: Out of bed as tolerated GOALS OF CARE: She is a DNR FAMILY DISCUSSIONS: No family available for discussion DISPOSITION: Intensive care unit, [facility].  [facility].  LABS  Glucose No results for input(s): GLUCAP in the last 168 hours.  BMET Recent Labs  Lab 01/26/19 0704 02/09/2019 0612 01/29/19 0237  NA 143 140 141  K 3.5 4.0 3.9  CL 100 96* 96*  CO2 35* 33* 34*  BUN 17 22* 27*  CREATININE 0.50 0.73 0.51  GLUCOSE 137* 131* 102*    Liver Enzymes Recent Labs  Lab 01/26/19 0704  ALBUMIN 2.2*    Electrolytes Recent Labs  Lab 01/24/19 0326 01/26/19 0704 01/27/19 0426 01/29/2019 0612 01/29/19 0237  CALCIUM 9.0 8.9  --  9.2 8.8*  MG 1.9 1.7 1.9  --   --   PHOS 3.5 3.8  --   --   --     CBC Recent Labs  Lab 01/26/19 0704 02/05/2019 0625 01/29/19 0237  WBC 15.8* 18.2* 9.9  HGB 13.0 13.9 11.7*  HCT 40.6 42.5 36.3  PLT 340 336 246    ABG Recent Labs  Lab 01/27/19 1735 01/27/19 2120  PHART 7.510* 7.506*  PCO2ART 48.3* 48.2*  PO2ART 49.5* 54.4*    Coag's No results for input(s): APTT, INR in the last 168 hours.  Sepsis Markers No results for input(s): LATICACIDVEN, PROCALCITON, O2SATVEN in the last 168 hours.  Cardiac Enzymes Recent Labs  Lab 01/27/19 1920 2019-06-06 0612  TROPONINI <0.03 <0.03    PAST MEDICAL HISTORY :   She  has a past medical history of Acute on chronic respiratory failure with hypoxia (HCC),  Anxiety, Chronic atrial fibrillation with rapid ventricular response, COPD (chronic obstructive pulmonary disease) (HCC), Dementia (HCC), Edema, Lobar pneumonia, unspecified organism (HCC), MDD (major depressive disorder), Pleural effusion, left, Schizoaffective disorder (HCC), Severe sepsis (HCC), and Unspecified lack of coordination.  PAST SURGICAL HISTORY:  She  has a past surgical history that includes Patient unable to contribute to surgical history due to dementia/schizophrenia and PERICARDIOCENTESIS (N/A, 02/05/2019).  Allergies  Allergen Reactions  . Levaquin [Levofloxacin In D5w] Other (See Comments)    Per MAR  . Penicillins Other (See Comments)    Per MAR Did it involve swelling of the face/tongue/throat, SOB, or low BP? Unknown Did it involve sudden or severe rash/hives, skin peeling, or any reaction on the inside of your mouth or nose? Unknown Did you need to seek medical attention at a hospital or doctor's office? Unknown When did it last happen?unk If all above answers are "NO", may proceed with cephalosporin use.     No current facility-administered medications on file prior to encounter.    Current Outpatient Medications on File Prior to Encounter  Medication Sig  . acetaminophen (TYLENOL) 325 MG tablet Take 650 mg by mouth every 6 (six) hours as needed for mild pain.   [name]. ALPRAZolam (XANAX) 0.5 MG tablet Take 0.5 mg by mouth 2 (two) times daily as needed for anxiety.  [name]. aspirin 81 MG chewable tablet Chew 81 mg by mouth daily.  . budesonide (PULMICORT) 0.5 MG/2ML nebulizer solution Take 0.5 mg by nebulization 2 (two) times daily.  . digoxin (LANOXIN) 0.125 MG tablet Take 0.125 mg by mouth daily.  [name]. doxycycline 100 mg in sodium chloride 0.9 % 250 mL Inject 100 mg into the vein every 12 (twelve) hours.  . famotidine (PEPCID) 20 MG tablet Take 20 mg by mouth 2 (two) times daily.  . furosemide (LASIX) 10 MG/ML solution Take 40 mg by mouth 2 (two) times daily.  [name].  HALOPERIDOL DECANOATE IM Inject 2 mg into the muscle every 8 (eight) hours as needed (anxiety or agitation).  . heparin 5000 UNIT/ML injection Inject 5,000 Units into the skin every 8 (eight) hours.  [name]. ipratropium-albuterol (DUONEB) 0.5-2.5 (3) MG/3ML SOLN Take 3 mLs by nebulization every 6 (six) hours as needed (shortness of breath or wheezing).  [name]. levothyroxine (SYNTHROID) 100 MCG tablet Take 100 mcg by mouth daily before breakfast.  . magnesium oxide (MAG-OX) 400 MG tablet Take 400 mg by mouth 2 (two) times daily.  . Melatonin 3 MG TABS Take 3 mg by mouth at bedtime.  . methylPREDNISolone sodium succinate (SOLU-MEDROL) 40 mg/mL injection Inject 40 mg into the vein daily.  . metoprolol tartrate (LOPRESSOR) 5 MG/5ML SOLN injection Inject 5 mg into the vein every 6 (six) hours as needed (HR > 120 or  Hold if SBP < 100).  . metoprolol tartrate (LOPRESSOR) 50 MG tablet Take 50 mg by mouth 2 (two) times daily.  . mirtazapine (REMERON) 15 MG tablet Take 15 mg by mouth at bedtime.  [name]. morphine 2 MG/ML injection Inject 1 mg into the vein every 8 (eight)  hours as needed (pain).  . Multiple Vitamin (MULTIVITAMIN WITH MINERALS) TABS tablet Take 1 tablet by mouth daily.  . Multiple Vitamins-Minerals (MULTIVITAMINS THER. W/MINERALS) TABS tablet Take 1 tablet by mouth daily.  . ondansetron (ZOFRAN) 4 MG tablet Take 4 mg by mouth every 6 (six) hours as needed for nausea or vomiting.  . ondansetron (ZOFRAN) 4 MG/5ML solution Take 4 mg by mouth every 6 (six) hours as needed for nausea or vomiting.  [name] oxyCODONE (OXY IR/ROXICODONE) 5 MG immediate release tablet Take 5 mg by mouth every 6 (six) hours as needed for severe pain.  . potassium chloride (KLOR-CON) 20 MEQ packet Take 20 mEq by mouth daily.  . Probiotic Product (DIGESTIVE ADVANTAGE PO) Take 1 capsule by mouth daily.  . QUEtiapine (SEROQUEL) 25 MG tablet Take 25 mg by mouth 2 (two) times daily.  . Revefenacin (YUPELRI) 175 MCG/3ML SOLN Inhale 175 mcg into  the lungs daily.  . sertraline (ZOLOFT) 100 MG tablet Take 200 mg by mouth daily.   [name] valproic acid (DEPAKENE) 250 MG/5ML SOLN solution Take 500 mg by mouth 2 (two) times a day.  [name] doxycycline (VIBRAMYCIN) 100 MG capsule Take 1 capsule (100 mg total) by mouth 2 (two) times daily. (Patient not taking: Reported on 02-21-2019)    FAMILY HISTORY:   Her family history is not on file.  SOCIAL HISTORY:  She  reports that she does not drink alcohol or use drugs.  CC APP Time 30 minutes   Cyndie Mull, AGACNP-BC [facility] PCCM Pager [phone] After 4 pm call  [phone] 01/30/2019, 8:17 AM

## 2019-01-30 NOTE — Progress Notes (Signed)
Ref: Patient, No Pcp Per   Subjective:  Patient had acute decompensation with worsening agitation. Precedex restarted at 0.8 mcg. CXR was positive for right lung collapse with mucus plugging per CCM. Oxygen saturation is improving post chest percussion. Patient had good urine output with lasix dose in AM. Electrolytes are stable. She converted to sinus rhythm this AM and diltiazem drip is off.   Objective:  Vital Signs in the last 24 hours: Temp:  [97.1 F (36.2 C)-98 F (36.7 C)] 98 F (36.7 C) (05/08 1129) Pulse Rate:  [32-146] 69 (05/08 1500) Cardiac Rhythm: Normal sinus rhythm (05/08 0830) Resp:  [13-34] 20 (05/08 1500) BP: (80-127)/(59-91) 83/67 (05/08 1500) SpO2:  [60 %-99 %] 83 % (05/08 1500)  Physical Exam: BP Readings from Last 1 Encounters:  01/30/19 (!) 83/67     Wt Readings from Last 1 Encounters:  01/29/19 83.1 kg    Weight change:  There is no height or weight on file to calculate BMI. HEENT: [place]/AT, Eyes-Brown, PERL, EOMI, Conjunctiva-Pale, Sclera-Non-icteric Neck: No JVD, No bruit, Trachea midline. Lungs:  Clearing left side, coarse right side.. Cardiac:  Regular rhythm, normal S1 and S2, no S3. II/VI systolic murmur. Abdomen:  Soft, non-tender. BS present. Extremities:  No edema present. No cyanosis. No clubbing. CNS: AxOx1, Cranial nerves grossly intact, moves all 4 extremities.  Skin: Warm and dry.   Intake/Output from previous day: 05/07 0701 - 05/08 0700 In: 1524.8 [P.O.:1320; I.V.:204.8] Out: 525 [Urine:525]    Lab Results: BMET    Component Value Date/Time   NA 141 01/29/2019 0237   NA 140 2019-02-25 0612   NA 143 01/26/2019 0704   K 3.9 01/29/2019 0237   K 4.0 Feb 25, 2019 0612   K 3.5 01/26/2019 0704   CL 96 (L) 01/29/2019 0237   CL 96 (L) 02/25/2019 0612   CL 100 01/26/2019 0704   CO2 34 (H) 01/29/2019 0237   CO2 33 (H) 2019/02/25 0612   CO2 35 (H) 01/26/2019 0704   GLUCOSE 102 (H) 01/29/2019 0237   GLUCOSE 131 (H) 25-Feb-2019 0612   GLUCOSE 137 (H) 01/26/2019 0704   BUN 27 (H) 01/29/2019 0237   BUN 22 (H) 02/25/2019 0612   BUN 17 01/26/2019 0704   CREATININE 0.51 01/29/2019 0237   CREATININE 0.73 2019/02/25 0612   CREATININE 0.50 01/26/2019 0704   CALCIUM 8.8 (L) 01/29/2019 0237   CALCIUM 9.2 25-Feb-2019 0612   CALCIUM 8.9 01/26/2019 0704   GFRNONAA >60 01/29/2019 0237   GFRNONAA >60 02-25-19 0612   GFRNONAA >60 01/26/2019 0704   GFRAA >60 01/29/2019 0237   GFRAA >60 02-25-2019 0612   GFRAA >60 01/26/2019 0704   CBC    Component Value Date/Time   WBC 9.9 01/29/2019 0237   RBC 3.69 (L) 01/29/2019 0237   HGB 11.7 (L) 01/29/2019 0237   HCT 36.3 01/29/2019 0237   PLT 246 01/29/2019 0237   MCV 98.4 01/29/2019 0237   MCH 31.7 01/29/2019 0237   MCHC 32.2 01/29/2019 0237   RDW 15.1 01/29/2019 0237   LYMPHSABS 0.8 01/21/2019 0439   MONOABS 0.3 01/21/2019 0439   EOSABS 0.0 01/21/2019 0439   BASOSABS 0.0 01/21/2019 0439   HEPATIC Function Panel Recent Labs    01/21/19 0439  PROT 6.7   HEMOGLOBIN A1C No components found for: HGA1C,  MPG CARDIAC ENZYMES Lab Results  Component Value Date   CKTOTAL <5 (L) 2019/02/25   CKMB 0.6 February 25, 2019   TROPONINI <0.03 25-Feb-2019   TROPONINI <0.03 01/27/2019  BNP No results for input(s): PROBNP in the last 8760 hours. TSH Recent Labs    01/21/19 0429  TSH 1.397   CHOLESTEROL No results for input(s): CHOL in the last 8760 hours.  Scheduled Meds: . ALPRAZolam  0.5 mg Oral BID  . colchicine  0.6 mg Oral Daily  . divalproex  500 mg Oral TID  . furosemide  40 mg Intravenous BID  . guaiFENesin  1,200 mg Oral Daily  . mouth rinse  15 mL Mouth Rinse BID  . metoprolol tartrate  50 mg Oral BID  . potassium chloride  20 mEq Oral BID  . QUEtiapine  200 mg Oral QHS  . senna  1 tablet Oral BID  . sertraline  100 mg Oral Daily   Continuous Infusions: . 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 Quick look echo showed small and stable pericardial effusion. Very little drainage overnight. Potassium supplement. Oral diltiazem for heart rate control.   LOS: 2 days    Orpah CobbAjay Rayvn Rickerson  MD  01/30/2019, 3:59 PM

## 2019-01-30 NOTE — Progress Notes (Addendum)
eLink Physician-Brief Progress Note Patient Name: Christine Dunlap DOB: 1956/09/11 MRN: [id number]   Date of Service  01/30/2019  HPI/Events of Note  Patient has agitated delirium, patient requires NT suction of airway secretions prn  eICU Interventions  Precedex infusion for agitated delirium, NT suction order placed.        Thomasene Lot Saima Monterroso 01/30/2019, 2:14 AM

## 2019-01-30 NOTE — Progress Notes (Signed)
Multiple camera visits to pt's room due to low sats on monitor. Pt quite agitated. RNs and providers at bedside assessing patient's condition. Encouraged staff to call Elink if they needed any assistance.

## 2019-01-30 NOTE — Progress Notes (Signed)
   01/30/19 1245  Vitals  Pulse Rate 61  ECG Heart Rate 64  Resp (!) 32  Oxygen Therapy  SpO2 (!) 60 %  O2 Device HFNC  O2 Flow Rate (L/min) 15 L/min  Pulse Oximetry Type Continuous   Patient with acute desaturation with no known precipating event. Worsening agitation noted. Patient screaming and hitting RN with attempt to place NRB. O2 flow increased to 15LPM. Lowest saturations noted 50%. Patient dusky with cyanotic lips during event. Alexandria Lodge, NP, charge RN and RT called to bedside. Precedex restarted per Alexandria Lodge, NP. Medications administered per EMAR. Chest x-ray obtained. Percussion via mattress performed and patient put into chair position to facilitate cough. Agitation slightly improved. Will continue to closely monitor.

## 2019-01-30 NOTE — Progress Notes (Signed)
PCCM  Internum Progress Note  S Called to patient bedside for drop in oxygen saturations to 60's and 70's. Pt. Will not let staff place a face mask, she is very agitated and pulls mask off.  Pt had eaten about 1 hour prior to desaturation event O She was increased from 8L HFNC to 18 L HFNC. Sats did not respond. Pt had diuresed 650 cc's after dose of 40 of Lasix at 10 am Precedex was restarted at 0.8 for agitation which was contributing to respiratory distress. Tardive Dyskinesia, abnormal swallow?? ? Aspiration risk   A CXR shows Right lung collapse>> suspect mucus plugging Pleural Effusion Overload 2/2 HF Poor co-operation/ compliance which limits ability to treat  P Chest PT per bed Q 4 while awake Additional dose Lasix 40 now Continue mucolytic Bed to chair position [facility] elevated Aggressive Pulmonary Toilet/ Flutter valve as able CXR 5/9  am Consider Palliation if patient continues to decline.   Bevelyn Ngo, AGACNP-BC [facility] Pulmonary/Critical Care Medicine Pager # [phone] After 4 pm please call [phone] 01/30/2019 2:06 PM

## 2019-01-31 ENCOUNTER — Inpatient Hospital Stay (HOSPITAL_COMMUNITY): Payer: Medicare Other

## 2019-01-31 LAB — BASIC METABOLIC PANEL
Anion gap: 11 (ref 5–15)
BUN: 14 mg/dL (ref 6–20)
CO2: 36 mmol/L — ABNORMAL HIGH (ref 22–32)
Calcium: 8.8 mg/dL — ABNORMAL LOW (ref 8.9–10.3)
Chloride: 92 mmol/L — ABNORMAL LOW (ref 98–111)
Creatinine, Ser: 0.48 mg/dL (ref 0.44–1.00)
GFR calc Af Amer: >60 mL/min (ref >60)
GFR calc non Af Amer: >60 mL/min (ref >60)
Glucose, Bld: 113 mg/dL — ABNORMAL HIGH (ref 70–99)
Potassium: 4.2 mmol/L (ref 3.5–5.1)
Sodium: 139 mmol/L (ref 135–145)

## 2019-01-31 LAB — CBC
HCT: 40.2 % (ref 36.0–46.0)
Hemoglobin: 12.7 g/dL (ref 12.0–15.0)
MCH: 31 pg (ref 26.0–34.0)
MCHC: 31.6 g/dL (ref 30.0–36.0)
MCV: 98 fL (ref 80.0–100.0)
Platelets: 224 10*3/uL (ref 150–400)
RBC: 4.1 MIL/uL (ref 3.87–5.11)
RDW: 14.7 % (ref 11.5–15.5)
WBC: 9.7 10*3/uL (ref 4.0–10.5)
nRBC: 0 % (ref 0.0–0.2)

## 2019-01-31 LAB — BRAIN NATRIURETIC PEPTIDE: B Natriuretic Peptide: 91.5 pg/mL (ref 0.0–100.0)

## 2019-01-31 LAB — MAGNESIUM: Magnesium: 2.1 mg/dL (ref 1.7–2.4)

## 2019-01-31 LAB — ECHOCARDIOGRAM LIMITED: Weight: 2948.87 oz

## 2019-01-31 MED ORDER — DILTIAZEM HCL 25 MG/5ML IV SOLN
10.0000 mg | Freq: Once | INTRAVENOUS | Status: AC
  Administered 2019-01-31: 5 mg via INTRAVENOUS
  Filled 2019-01-31: qty 5

## 2019-01-31 MED ORDER — AMIODARONE LOAD VIA INFUSION
150.0000 mg | Freq: Once | INTRAVENOUS | Status: AC
  Administered 2019-01-31: 150 mg via INTRAVENOUS
  Filled 2019-01-31: qty 83.34

## 2019-01-31 MED ORDER — DIGOXIN 0.25 MG/ML IJ SOLN
0.2500 mg | Freq: Once | INTRAMUSCULAR | Status: AC
  Administered 2019-01-31: 0.25 mg via INTRAVENOUS
  Filled 2019-01-31: qty 2

## 2019-01-31 MED ORDER — HEPARIN (PORCINE) 25000 UT/250ML-% IV SOLN
1250.0000 [IU]/h | INTRAVENOUS | Status: DC
  Administered 2019-01-31: 1250 [IU]/h via INTRAVENOUS
  Filled 2019-01-31: qty 250

## 2019-01-31 MED ORDER — DEXMEDETOMIDINE HCL IN NACL 200 MCG/50ML IV SOLN
0.4000 ug/kg/h | INTRAVENOUS | Status: DC
  Administered 2019-01-31: 0.5 ug/kg/h via INTRAVENOUS
  Administered 2019-01-31 – 2019-02-01 (×3): 0.4 ug/kg/h via INTRAVENOUS
  Filled 2019-01-31 (×3): qty 50

## 2019-01-31 MED ORDER — MORPHINE SULFATE (PF) 2 MG/ML IV SOLN
0.5000 mg | Freq: Once | INTRAVENOUS | Status: AC
  Administered 2019-01-31: 0.5 mg via INTRAVENOUS
  Filled 2019-01-31: qty 1

## 2019-01-31 MED ORDER — AMIODARONE HCL IN DEXTROSE 360-4.14 MG/200ML-% IV SOLN
60.0000 mg/h | INTRAVENOUS | Status: AC
  Administered 2019-01-31 (×2): 60 mg/h via INTRAVENOUS
  Filled 2019-01-31: qty 200

## 2019-01-31 MED ORDER — PANTOPRAZOLE SODIUM 40 MG PO TBEC
40.0000 mg | DELAYED_RELEASE_TABLET | Freq: Every day | ORAL | Status: DC
  Administered 2019-01-31: 40 mg via ORAL
  Filled 2019-01-31: qty 1

## 2019-01-31 MED ORDER — ASPIRIN 325 MG PO TABS
325.0000 mg | ORAL_TABLET | Freq: Two times a day (BID) | ORAL | Status: DC
  Administered 2019-01-31 (×2): 325 mg via ORAL
  Filled 2019-01-31 (×2): qty 1

## 2019-01-31 MED ORDER — MORPHINE SULFATE (PF) 2 MG/ML IV SOLN
1.0000 mg | INTRAVENOUS | Status: DC | PRN
  Filled 2019-01-31: qty 1

## 2019-01-31 MED ORDER — DILTIAZEM HCL 60 MG PO TABS
30.0000 mg | ORAL_TABLET | Freq: Once | ORAL | Status: AC
  Administered 2019-01-31: 30 mg via ORAL
  Filled 2019-01-31: qty 1

## 2019-01-31 MED ORDER — COLCHICINE 0.6 MG PO TABS
0.6000 mg | ORAL_TABLET | Freq: Two times a day (BID) | ORAL | Status: DC
  Administered 2019-01-31 (×2): 0.6 mg via ORAL
  Filled 2019-01-31 (×2): qty 1

## 2019-01-31 MED ORDER — SODIUM CHLORIDE 0.9 % IV BOLUS
500.0000 mL | Freq: Once | INTRAVENOUS | Status: AC
  Administered 2019-01-31: 500 mL via INTRAVENOUS

## 2019-01-31 MED ORDER — AMIODARONE HCL IN DEXTROSE 360-4.14 MG/200ML-% IV SOLN
30.0000 mg/h | INTRAVENOUS | Status: DC
  Administered 2019-01-31 (×2): 30 mg/h via INTRAVENOUS
  Filled 2019-01-31 (×2): qty 200

## 2019-01-31 MED ORDER — FUROSEMIDE 10 MG/ML IJ SOLN
40.0000 mg | Freq: Once | INTRAMUSCULAR | Status: AC
  Administered 2019-01-31: 40 mg via INTRAVENOUS
  Filled 2019-01-31: qty 4

## 2019-01-31 MED ORDER — ASPIRIN 325 MG PO TABS: 325.0000 mg | ORAL_TABLET | Freq: Every day | ORAL | Status: DC

## 2019-01-31 NOTE — Progress Notes (Signed)
Patient does not tolerate CPT. RT will continue to monitor as needed. Pt will desat and will take a while to return to 80%.

## 2019-01-31 NOTE — Progress Notes (Signed)
PULMONARY / CRITICAL CARE MEDICINE   NAME:  Christine [facility], MRN:  [id number], DOB:  08-May-1956, LOS: 3 ADMISSION DATE:  Feb 23, 2019, CONSULTATION DATE: 01/29/2019 REFERRING MD: Cardiology, CHIEF COMPLAINT: Hypoxia  BRIEF HISTORY:    63 year old demented patient admitted to [facility] with reported sepsis from pneumonia. HISTORY OF PRESENT ILLNESS   63 year old female who was admitted to [facility] on 01/20/2019 from [facility].  Reportedly has acute on chronic respiratory failure is felt to have sepsis from questionable pneumonia.  She was hemodynamically stable.  She was found to have bilateral pleural effusions and underwent interventional radiology tap of the right pleural effusion for 900 cc on 01/26/2019.  Results CT scan revealed pericardial effusion she was transferred to [facility] 02-23-19 by cardiology underwent paracentesis with 820 cc of fluid obtained.  He does have a past medical history significant for chronic structural pulmonary disease.  While she was at [facility] she was transition to a DNR.  On 01/29/2019 pulmonary critical care was consulted for hypoxia.  Note she is being followed by pulmonary and [facility] is actually less oxygen now than when she was at [facility].  She does desaturate with any type of activity or taking her facemask off.  Currently she is on approximately 6% FiO2 via nonrebreather.  There is a question whether not she can undergo thoracentesis again as there is significant fluid noted x-rays.  SIGNIFICANT PAST MEDICAL HISTORY    acute on chronic respiratory failure Advanced dementia Schizoaffective disorder Hypothyroidism Advanced COPD Gout  SIGNIFICANT EVENTS:   Episode of desaturation yesterday with associated right lower lobe collapse.  Improved with chest physiotherapy.  Effectiveness of latter limited by patient compliance.  STUDIES:   February 23, 2019>>  CTA No evidence of pulmonary embolus. Very large pericardial effusion. Moderate bilateral pleural effusions with dependent atelectasis of lower lobes. Ground-glass opacities and areas of intralobular septal thickening "crazy paving" which can be seen with atypical pneumonia including viral pneumonia, pulmonary hemorrhage, organizing pneumonia as well as additional non infectious etiology.  02-23-19 Echo Left Ventricle: The left ventricle has normal systolic function, with an ejection fraction of 60-65%. The cavity size was normal. There is no increase in left ventricular wall thickness.  Right Ventricle: The right ventricle has normal systolic function. The cavity was normal. There is no increase in right ventricular wall thickness. Left Atrium: Left atrial size was normal in size. Right Atrium: Right atrial size was normal in size. Interatrial Septum: No atrial level shunt detected by color flow Doppler. Pericardium: A small pericardial effusion is present. The pericardial effusion is surrounding the apex. There is a large pleural effusion in either the left or right lateral region. Mitral Valve: Mitral valve regurgitation was not assessed by color flow Doppler. Tricuspid Valve: Tricuspid valve regurgitation was not visualized by color flow Doppler.  CULTURES:  02-23-2019 pericardiocentesis fluid>> 01/26/2019 pleural fluid>> GS No Growth: Cx>> ANTIBIOTICS:  01/29/2019 not currently on antimicrobial therapy  LINES/TUBES:   02/23/2019 pericardiocentesis drain placed by cardiology CONSULTANTS:  01/29/2019 pulmonary critical care SUBJECTIVE:  Frail female who is in no acute distress able to eat and phonate without difficulty. She is calm this morning, precedex currently on hold  CONSTITUTIONAL: BP 98/80   Pulse (!) 137   Temp 98.4 F (36.9 C) (Axillary)   Resp (!) 28   Wt 83.6 kg   SpO2 92%   I/O last 3 completed shifts: In: 1091.5 [P.O.:360; I.V.:640.7; IV Piggyback:90.8] Out:  725 [Urine:725]        PHYSICAL EXAM: General: Frail  disheveled female, sitting in bed on 8 L HF oxygen Neuro: Follows simple commands.  Orientated to self only, answers some simple questions, MAE x 4 HEENT: NCAT, Edentulous, trace JVD, no lymphadenopathy is appreciated Cardiovascular: S1, S2,  irregular RR, Currently appears to be in NSR, rate of 70, pericardial drain is noted at the xiphoid process, minimal output. Lungs: Increased respiratory effort with rhonchi present bilaterally.  Decreased right lower lobe air entry Abdomen: Obese soft , ND, positive bowel sounds,  Musculoskeletal: Intact, no obvious deformities Skin: Warm and dry, no rash or lesions noted  RESOLVED PROBLEM LIST   ASSESSMENT AND PLAN    Acute on chronic respiratory insufficiency in the setting of bilateral pleural effusions status post right thoracentesis for 900 cc, pericardial effusion status post pericardiocentesis for 820 cc, advanced chronic restrictive pulmonary disease.  Former smoker.  Currently lives at [facility] secondary to advanced dementia. Advanced COPD with limited compliance with chest physiotherapy. She has been weaned to 8 L HF [facility], sats are 92% She is able to eat, cough and deep breathing on command She has [facility]. Titrate oxygen to keep sats greater than 88% Aggressive Pulmonary toilet IS Q 1 while awake Will add Mucinex as mucolytic to see if she can better cough up secretions Note she is a DNR.  Status post pericardiocentesis with 820 cc removed Atrial fibrillation with intermittent rapid response on diltiazem drip Restarted home beta-blocker.  Advanced dementia Schizoaffective disorder Manic depressive disorder Sundowner Syndrome Per primary Continue medications as needed  Wean Precedex to off as able  History of recent sepsis which seems to be resolved at this time. Monitor hemodynamic  status   SUMMARY OF TODAY'S PLAN:  63 year old demented patient evaluated for hypoxia.. Weaned to 8 L HFNC over night  Sats currently > 92% Continue aggressive chest physiotherapy and bronchodilators.  Best Practice / Goals of Care / Disposition.   DVT PROPHYLAXIS: Compression hose SUP: PPI NUTRITION: Oral diet MOBILITY: Out of bed as tolerated GOALS OF CARE: She is a DNR FAMILY DISCUSSIONS: No family available for discussion DISPOSITION: Intensive care unit, [facility] came to [facility] which also has a critical care unit.  Consider returning to [facility].  LABS  Glucose No results for input(s): GLUCAP in the last 168 hours.  BMET Recent Labs  Lab 2018/11/20 0612 01/29/19 0237 01/31/19 0221  NA 140 141 139  K 4.0 3.9 4.2  CL 96* 96* 92*  CO2 33* 34* 36*  BUN 22* 27* 14  CREATININE 0.73 0.51 0.48  GLUCOSE 131* 102* 113*    Liver Enzymes Recent Labs  Lab 01/26/19 0704  ALBUMIN 2.2*    Electrolytes Recent Labs  Lab 01/26/19 0704 01/27/19 0426 2018/11/20 0612 01/29/19 0237 01/31/19 0221  CALCIUM 8.9  --  9.2 8.8* 8.8*  MG 1.7 1.9  --   --  2.1  PHOS 3.8  --   --   --   --     CBC Recent Labs  Lab 2018/11/20 0625 01/29/19 0237 01/31/19 0221  WBC 18.2* 9.9 9.7  HGB 13.9 11.7* 12.7  HCT 42.5 36.3 40.2  PLT 336 246 224    ABG Recent Labs  Lab 01/27/19 1735 01/27/19 2120  PHART 7.510* 7.506*  PCO2ART 48.3* 48.2*  PO2ART 49.5* 54.4*    Coag's No results for input(s): APTT, INR  in the last 168 hours.  Sepsis Markers No results for input(s): LATICACIDVEN, PROCALCITON, O2SATVEN in the last 168 hours.  Cardiac Enzymes Recent Labs  Lab 01/27/19 1920 Feb 02, 2019 0612  TROPONINI <0.03 <0.03     Lynnell Catalan, MD [facility] ICU Physician [facility] [place] [facility]  Pager: [phone] Mobile: [phone] After hours: [phone].  01/31/2019, 9:22 AM      01/31/2019, 9:19 AM

## 2019-01-31 NOTE — Progress Notes (Signed)
Pt received 500 cc bolus from orders of PCCM due to continued hypotension while on precedex. PCCM contacted again due to the return of hypotension after fluids have finished running.  Per PCCM MD continue precedex at 0.5 to aid in the ease of agitation and PCCM will attempt to contact family to discuss plans of care.  Orders of morphine added PRN for pain control as needed.    Pt still resting comfortably with marked hypotension and hypoxia, no change to lung fields.

## 2019-01-31 NOTE — Progress Notes (Signed)
Subjective:  Appreciate CCM consult and help.  Patient presently denies any complaints eating breakfast.  Remains in A. fib with RVR received IV Cardizem and digoxin earlier this morning.  O2 sats in high 80s to low 90s.  Objective:  Vital Signs in the last 24 hours: Temp:  [97.2 F (36.2 C)-98.4 F (36.9 C)] 98.4 F (36.9 C) (05/09 0728) Pulse Rate:  [32-137] 137 (05/09 0824) Resp:  [13-35] 28 (05/09 0824) BP: (83-136)/(59-91) 98/80 (05/09 0824) SpO2:  [60 %-97 %] 92 % (05/09 0824) Weight:  [83.6 kg] 83.6 kg (05/09 0400)  Intake/Output from previous day: 05/08 0701 - 05/09 0700 In: 832.5 [P.O.:240; I.V.:501.7; IV Piggyback:90.8] Out: 575 [Urine:575] Intake/Output from this shift: No intake/output data recorded.  Physical Exam: Neck: no adenopathy, no carotid bruit, no JVD and supple, symmetrical, trachea midline Lungs: Bilateral decreased breath sounds with occasional rhonchi noted Heart: irregularly irregular rhythm and Tachycardic S1-S2 soft Abdomen: soft, non-tender; bowel sounds normal; no masses,  no organomegaly Extremities: extremities normal, atraumatic, no cyanosis or edema  Lab Results: Recent Labs    01/29/19 0237 01/31/19 0221  WBC 9.9 9.7  HGB 11.7* 12.7  PLT 246 224   Recent Labs    01/29/19 0237 01/31/19 0221  NA 141 139  K 3.9 4.2  CL 96* 92*  CO2 34* 36*  GLUCOSE 102* 113*  BUN 27* 14  CREATININE 0.51 0.48   No results for input(s): TROPONINI in the last 72 hours.  Invalid input(s): CK, MB Hepatic Function Panel No results for input(s): PROT, ALBUMIN, AST, ALT, ALKPHOS, BILITOT, BILIDIR, IBILI in the last 72 hours. No results for input(s): CHOL in the last 72 hours. No results for input(s): PROTIME in the last 72 hours.  Imaging: Imaging results have been reviewed and Dg Chest 1 View  Result Date: 01/30/2019 CLINICAL DATA:  Shortness of breath and weakness. EXAM: CHEST  1 VIEW COMPARISON:  September 02, 2019 chest radiograph and CT FINDINGS: The  patient is rotated to the right. A new pericardial drain is present with decreased enlargement of the cardiopericardial silhouette on the left. The right heart border is obscured by progressive complete opacification of the lower half of the right hemithorax with paramediastinal density extending more superiorly, likely reflecting a combination of enlarging pleural effusion and lung consolidation or collapse. There is a small left pleural effusion with left basilar atelectasis. There is mild pulmonary vascular congestion. No pneumothorax is identified. IMPRESSION: 1. Interval pericardial drainage. 2. Substantial progression of right pleural effusion and right lung consolidation or collapse. 3. Small left pleural effusion and left basilar atelectasis. Electronically Signed   By: Sebastian AcheAllen  Grady M.D.   On: 01/30/2019 13:58   Dg Chest Port 1 View  Result Date: 01/31/2019 CLINICAL DATA:  Is Reason for exam: Respiratory failure EXAM: PORTABLE CHEST 1 VIEW COMPARISON:  01/30/2019 FINDINGS: Stable enlarged cardiac silhouette. Bilateral pleural effusions unchanged. Moderate effusion on the RIGHT and small effusion on the LEFT. A small bore pericardial drain noted. No pneumothorax. No pulmonary edema. IMPRESSION: 1. No interval change. 2. Cardiomegaly and bilateral moderate pleural effusions. Electronically Signed   By: Genevive BiStewart  Edmunds M.D.   On: 01/31/2019 07:07    Cardiac Studies: This is Assessment/Plan:  Recurrent A. fib with RVR chads vas score of 3 Possible acute pleuropericarditis infectious versus viral /COVID 19 negative Acute on chronic respiratory failure with hypoxemia secondary to above Compensated congestive heart failure secondary to preserved LV systolic function COPD Hypothyroidism Dementia  Plan We will start IV  amiodarone as per orders Continue p.o. Cardizem as blood pressure tolerates Check cultures Increase colchicine to 0.6 mg twice daily Start aspirin as per Orders  LOS: 3 days     Rinaldo Cloud 01/31/2019, 8:29 AM

## 2019-01-31 NOTE — Progress Notes (Signed)
eLink Physician-Brief Progress Note Patient Name: Drucella Och DOB: March 03, 1956 MRN: [id number]   Date of Service  01/31/2019  HPI/Events of Note  Call from bedside nurse reporting AF/RVR in patient on cardiology service.  Sats are also low in the 80s but not tracking with HR.  Nurse reports reduced UOP this shift.  Patient has coarse bilateral breath sounds and PCXR that shows continued edema.    eICU Interventions  Plan: Initally ordered Dilt 10 mg IVP along with 40 mg lasix IV with instructions for nurse to contact cardiology.  They ordered 5 of dilt and dig instead.  The low sats appear to artifactual as they do occasional increase to the low 90s and are not tracking with the HR.  Will continue to monitor patient.     Intervention Category Major Interventions: Arrhythmia - evaluation and management  Crosby Oriordan 01/31/2019, 5:57 AM

## 2019-01-31 NOTE — Progress Notes (Signed)
ANTICOAGULATION CONSULT NOTE - Follow Up Consult  Pharmacy Consult for Heparin Indication: atrial fibrillation  Allergies  Allergen Reactions  . Levaquin [Levofloxacin In D5w] Other (See Comments)    Per MAR  . Penicillins Other (See Comments)    Per MAR Did it involve swelling of the face/tongue/throat, SOB, or low BP? Unknown Did it involve sudden or severe rash/hives, skin peeling, or any reaction on the inside of your mouth or nose? Unknown Did you need to seek medical attention at a hospital or doctor's office? Unknown When did it last happen?unk If all above answers are "NO", may proceed with cephalosporin use.     Patient Measurements: Weight: 184 lb 4.9 oz (83.6 kg)  Vital Signs: Temp: 97.6 F (36.4 C) (05/09 1600) Temp Source: Axillary (05/09 1600) BP: 95/72 (05/09 1910) Pulse Rate: 74 (05/09 1910)  Labs: Recent Labs    01/29/19 0237 01/31/19 0221  HGB 11.7* 12.7  HCT 36.3 40.2  PLT 246 224  CREATININE 0.51 0.48    CrCl cannot be calculated (Unknown ideal weight.).   Assessment: 63 year old female to begin heparin for Afib  Goal of Therapy:  Heparin level 0.3-0.7 units/ml Monitor platelets by anticoagulation protocol: Yes   Plan:  No heparin bolus due to recent procedure Heparin drip at 1250 units / hr Daily heparin level, CBC  Thank you Okey Regal, PharmD [phone]  01/31/2019,7:26 PM

## 2019-01-31 NOTE — Progress Notes (Signed)
Lynnell Catalan MD PCCM paged in response to patient having anxiety episodes with associated periods of hypoxia and increased tachycardia.  Pt remains on amiodarone that was started this morning.  Pt yells out and has hypoxic episodes of oxygen saturations of 65%-80% on HFNC of 15 L/min.  Pt unable to tolerate blow by oxygen at this time due to patient not wanting it.  Lung fields full of rhonchi and pt says she needs to cough but cannot, oral suction done with minimal secretions able to be suctions.  Mental status remains the same.  0.5 mg IV push of morphine ordered by PCCM MD  Administered the dose with no change in patient status.  Pt allowing some blow by oxygen at this time with saturations 82%-88% still unable to cough and no change to the aforementioned lung sounds.    PCCM paged with update

## 2019-01-31 NOTE — Progress Notes (Signed)
  Echocardiogram 2D Echocardiogram has been performed.  [facility] 01/31/2019, 5:38 PM

## 2019-02-01 LAB — CULTURE, BODY FLUID W GRAM STAIN -BOTTLE: Culture: NO GROWTH

## 2019-02-01 MED ORDER — MORPHINE SULFATE (PF) 2 MG/ML IV SOLN
1.0000 mg | INTRAVENOUS | Status: DC | PRN
  Administered 2019-02-01 (×2): 2 mg via INTRAVENOUS
  Filled 2019-02-01: qty 1

## 2019-02-01 MED ORDER — DILTIAZEM HCL 25 MG/5ML IV SOLN
10.0000 mg | Freq: Once | INTRAVENOUS | Status: AC
  Administered 2019-02-01: 10 mg via INTRAVENOUS
  Filled 2019-02-01: qty 5

## 2019-02-02 LAB — CULTURE, BODY FLUID W GRAM STAIN -BOTTLE: Culture: NO GROWTH

## 2019-02-04 LAB — PH, BODY FLUID: pH, Body Fluid: 8.4

## 2019-02-04 LAB — PROTEIN, BODY FLUID (OTHER): Total Protein, Body Fluid Other: 5.2 g/dL

## 2019-02-23 NOTE — Progress Notes (Signed)
Body prepared and taken to [facility]. No belongings.

## 2019-02-23 NOTE — Discharge Summary (Signed)
Physician Death Summary  Patient ID: [facility] MRN: [id number] DOB/AGE: Mar 07, 1956 63 y.o.  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 She was hypotensive. She underwent emergent pericardiocentesis with some stabilization. Her pericardial drain had very little fluid accumulation over next 48 hours.  She had acute respiratory distress from mucus plug and right lung collapse. Chest PT and sedation worked for few hours for non-compliant and demented patient. Patient was DNR and not a candidate for aggressive management per family and CCM. She expired on 02/23/2019 at 6:36 AM. Appropriate disposition to local funeral home was made by nursing staff.  Consults: cardiology and pulmonary/intensive care  Significant Diagnostic Studies: labs: Elevated WBC count of 18.2 K improving to 9.7 on day of demise. Electrolytes and creatinine were near normal. Negative COVID-19 test. Pericardial fluid culture was borderline for elevated neutrophils but negative for culture growth. It had low LDH suggestive of transudate.  Treatments: Pericardiocentesis.   Discharge Exam: General appearance: appears stated age. Head: Normocephalic, atraumatic. Eyes: Brown eyes, pink conjunctiva, corneas clear.   Neck: No adenopathy. Resp: None Cardio: None GI:  Soft. Extremities: No edema, cyanosis or clubbing. Skin: Cool and dry.  Neurologic: Alert and oriented X 0.  Disposition: Funeral Home   Allergies as of 02/23/2019      Reactions   Levaquin [levofloxacin In D5w] Other (See Comments)   Per MAR   Penicillins Other (See Comments)   Per MAR Did it involve swelling of the face/tongue/throat, SOB, or low BP? Unknown Did it involve sudden or severe rash/hives, skin peeling, or any reaction on the inside of your mouth or nose? Unknown Did you need to seek medical attention at a hospital or doctor's office? Unknown When did it last happen?unk If all above answers are "NO", may proceed with cephalosporin use.                                                                           Signed: Ricki Rodriguez 02/03/2019, 4:25 PM

## 2019-02-23 NOTE — Progress Notes (Signed)
eLink Physician-Brief Progress Note Patient Name: [facility] DOB: 09/17/1956 MRN: [id number]   Date of Service  02/04/19  HPI/Events of Note  Progressive deterioration in patient's respiratory condition with drop in sats to the 70s and then the 50s with patient struggling to breath.  O2 sats monitor tracking occasionally but does have at times a decent waveform.  Patient is a DNR.  BiPAP was ordered but patient is on a NRB.  eICU Interventions  Work toward making patient as comfortable as possible.  Have increased PRN morphine for patient comfort.     Intervention Category Major Interventions: Respiratory failure - evaluation and management;Arrhythmia - evaluation and management  Rasaan Brotherton Feb 04, 2019, 3:59 AM

## 2019-02-23 NOTE — Progress Notes (Signed)
Patient transitioned from SVR to Afib RVR while normotensive. Patient unable to follow commands and was more somnolent than an hour before. Patient became hypoxic into the 60s, and non-rebreather was applied on top of the 15L HFNC. PCCM and Cardiology team notified. Cardiology asked to give 10mg  of Diltiazem which was performed with no results. Upon auscultation, R.Upper Lobe breath sounds were severely diminished with minimally appreciable air flow present. Asked PCCM of interventions such as diuresis, bipap, etc. PCCM attending explained patient was DNR/DNI and that as a team we would focus on making the patient comfortable if continued to become more hypoxic.

## 2019-02-23 NOTE — Progress Notes (Signed)
Per previous note on 5/10, spoke with CCM attending as patient's condition declined. As per CCM attending, patient not placed on BiPaP and  remained on HFNC/NRB, no further escalation of care occurred, and patient was made comfortable.  At 0636 pt. was confirmed by myself and Eloise Harman ,RN to not have any heartbeat or respirations. Patient's attending physician was contacted. Patient's daughter was informed as well. CCM attending notified.

## 2019-02-23 DEATH — deceased

## 2020-03-05 IMAGING — DX PORTABLE CHEST - 1 VIEW
1 series · 1 of 1 positions shown · non-contrast
Comparison: Chest x-rays dated 01/25/2019, [DATE] a.m., 01/23/2019
and 01/20/2019

CLINICAL DATA: Right pleural effusion.  Status post thoracentesis.

EXAM:
PORTABLE CHEST 1 VIEW [DATE] p.m.

[chest ap]
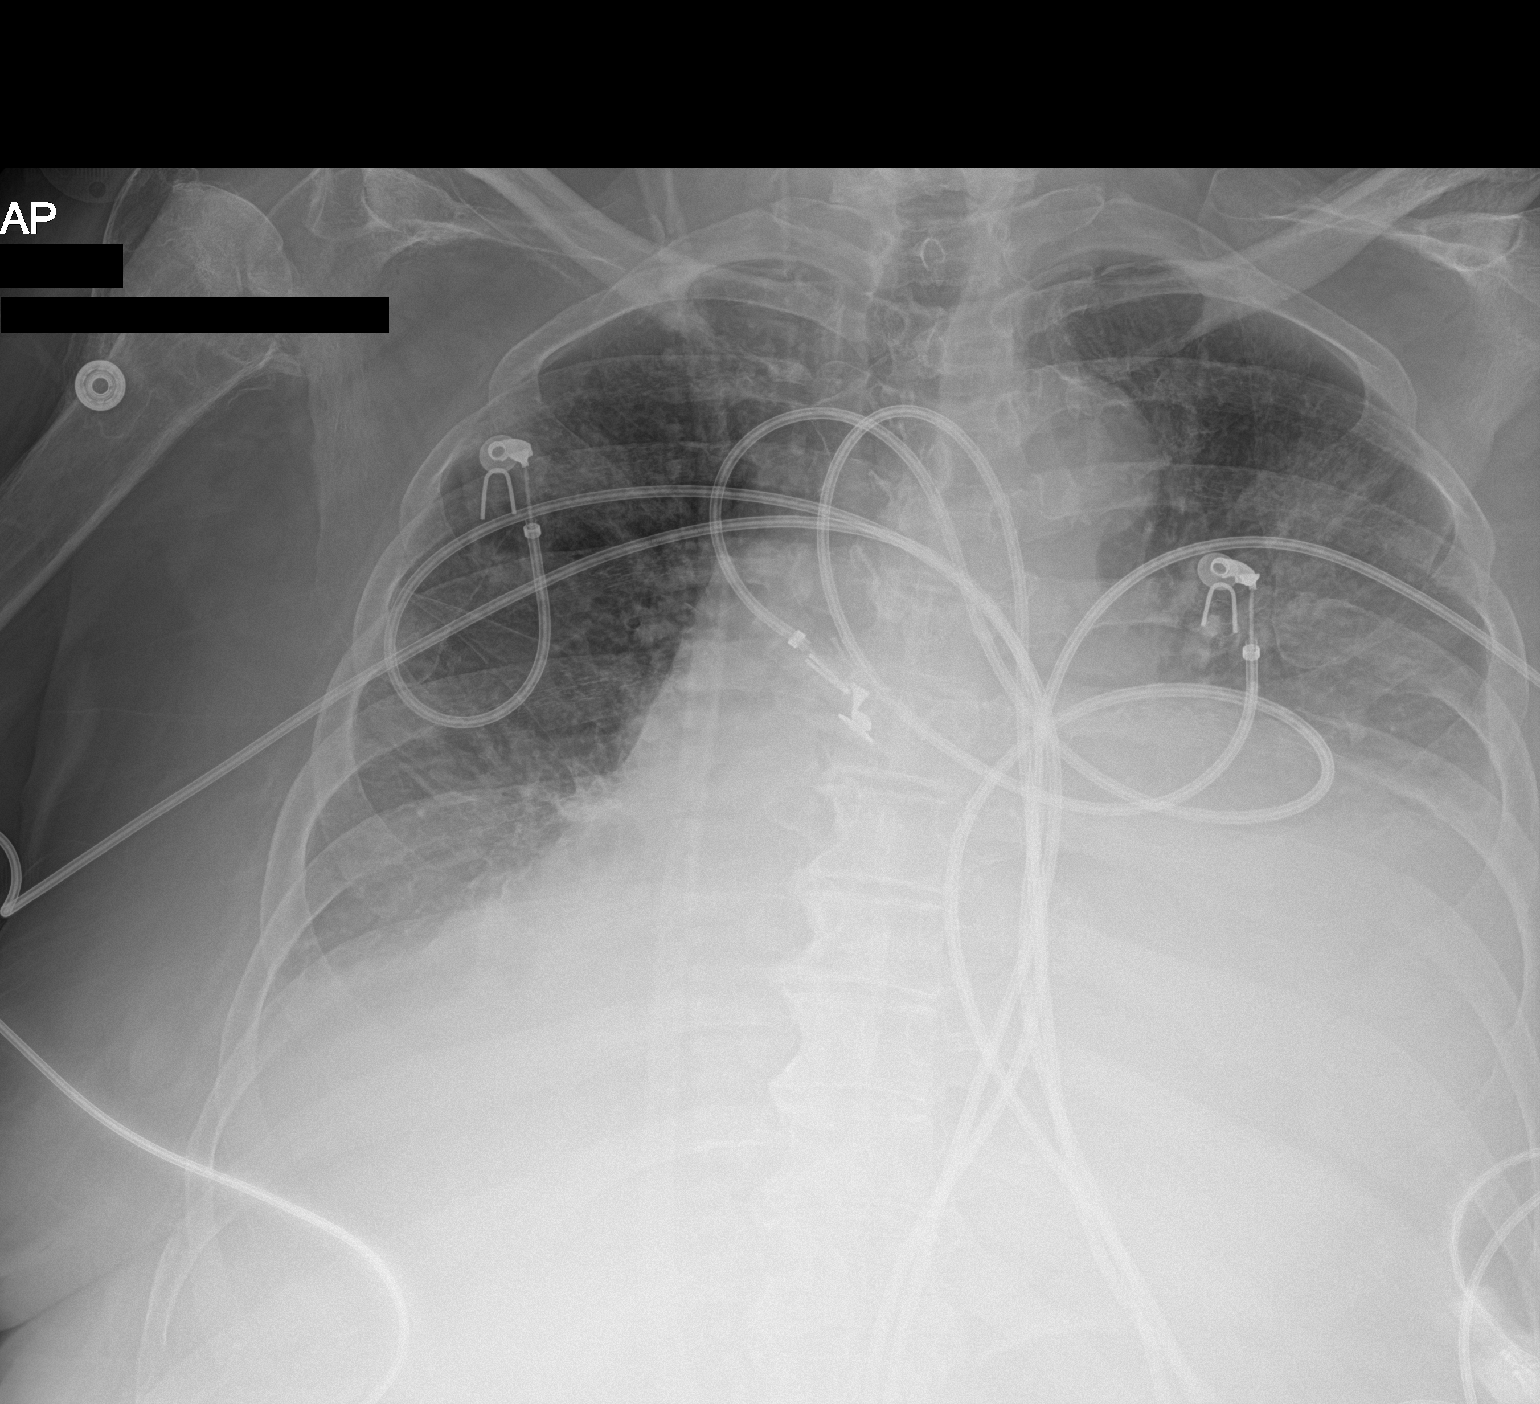

[1 of 1 positions shown; findings below may reference images not displayed]

FINDINGS: There is no pneumothorax after right thoracentesis. There has been
marked improvement in the now small right pleural effusion. Left
pleural effusion appears slightly larger.

Pulmonary vascularity is normal. Cardiac silhouette is obscured by
the effusions.
IMPRESSION: 1. No pneumothorax after right thoracentesis.
2. Diminished right effusion, now small.
3. Slightly increased moderate left effusion.

## 2020-03-06 IMAGING — DX PORTABLE CHEST - 1 VIEW
1 series · 1 of 1 positions shown · non-contrast
Comparison: January 26, 2019

CLINICAL DATA: Low oxygen saturation

EXAM:
PORTABLE CHEST 1 VIEW

[chest]
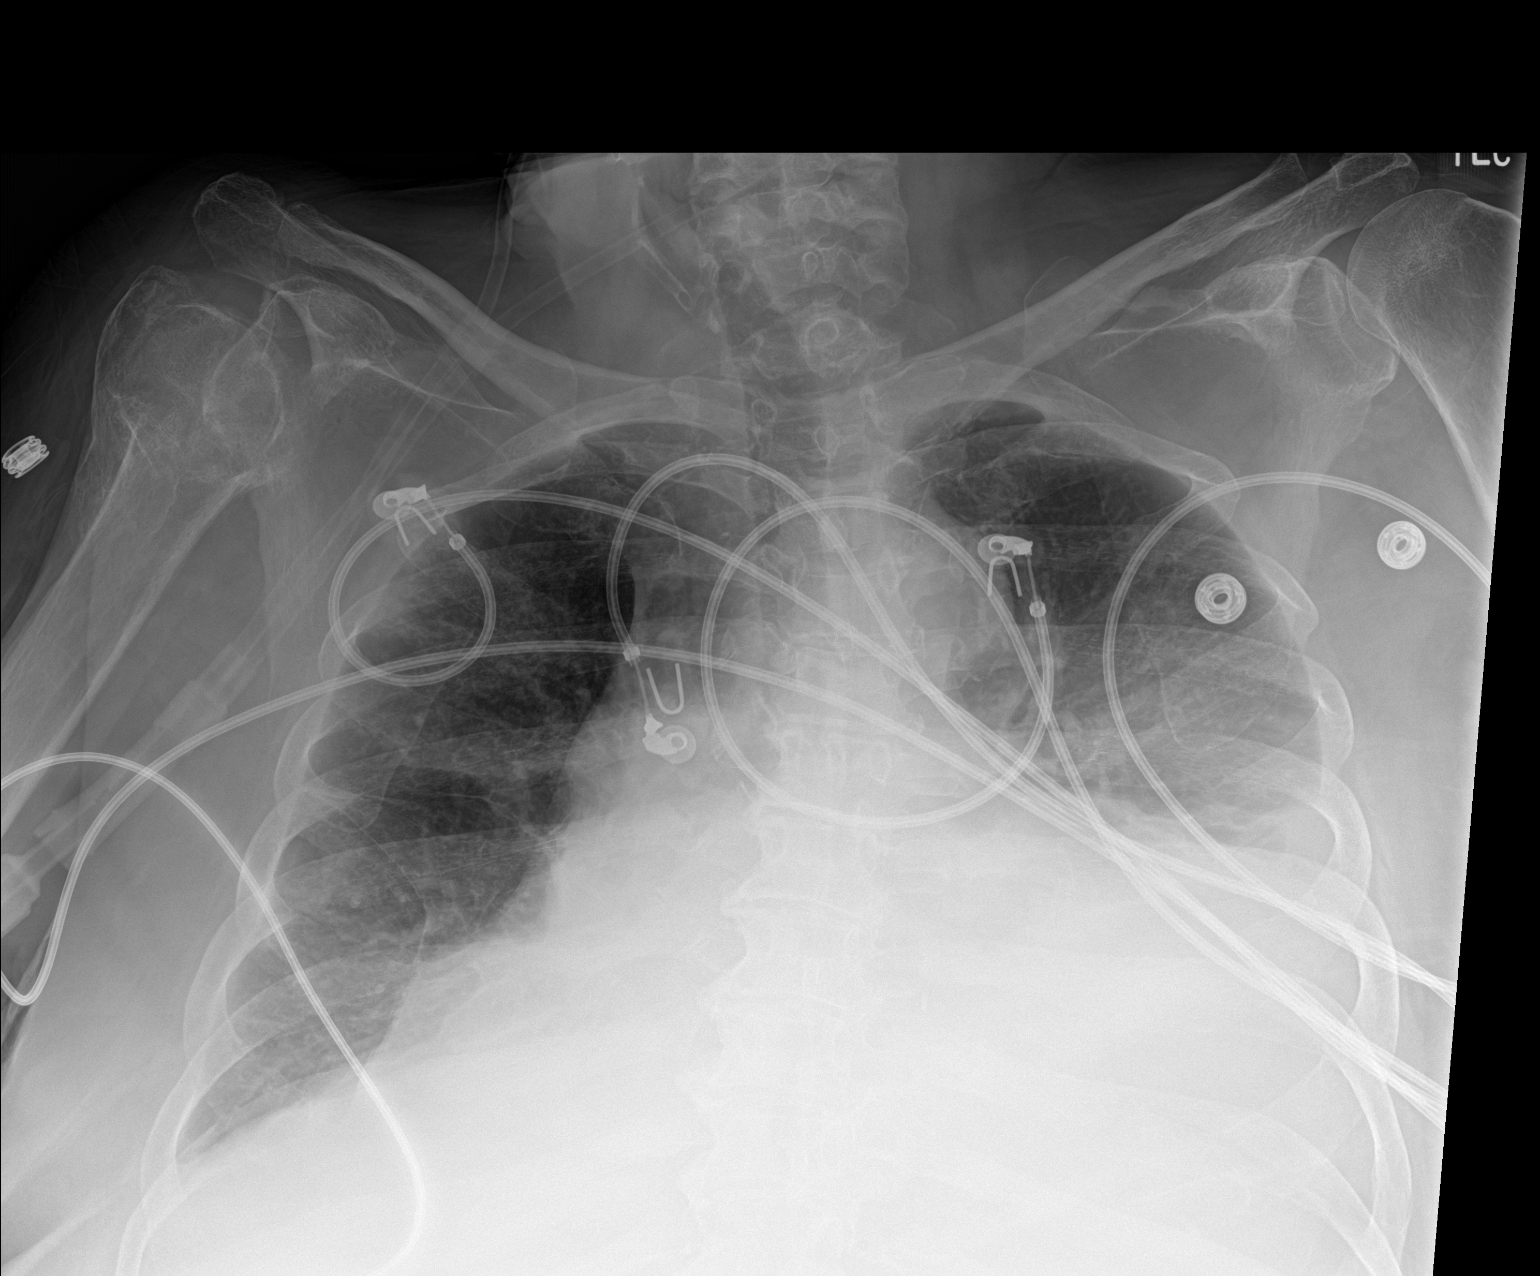

[1 of 1 positions shown; findings below may reference images not displayed]

FINDINGS: The heart size and mediastinal contours are stable. The heart size
is enlarged. Moderate left pleural effusion is identified with
consolidation of left lung base. Small right pleural effusion is
unchanged compared prior exam. No evidence of pulmonary edema. The
visualized skeletal structures are stable.
IMPRESSION: Moderate left pleural effusion with consolidation of left lung base
is unchanged.

Small right pleural effusion unchanged.

## 2020-03-07 IMAGING — DX PORTABLE CHEST - 1 VIEW
1 series · 1 of 1 positions shown · non-contrast
Comparison: Earlier today

CLINICAL DATA: Pleural effusion

EXAM:
PORTABLE CHEST 1 VIEW

[chest ap]
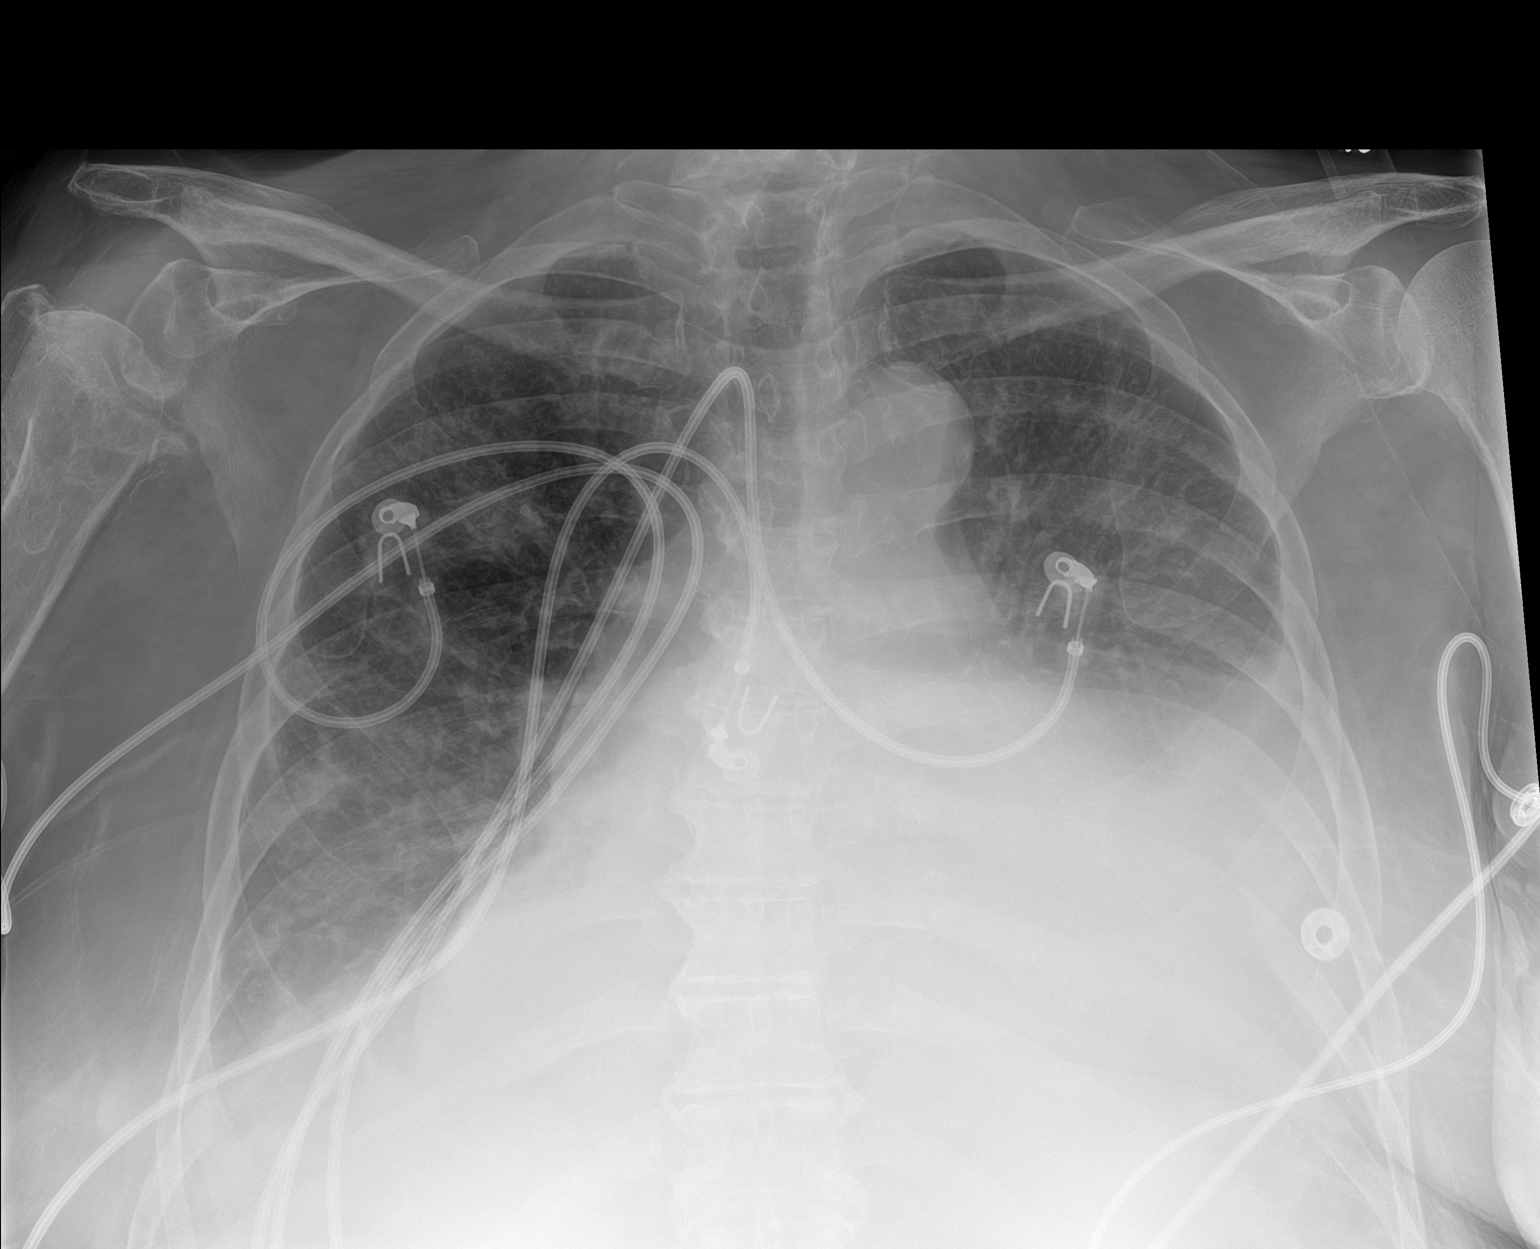

[1 of 1 positions shown; findings below may reference images not displayed]

FINDINGS: Cardiopericardial enlargement, pleural fluid by CT. There is
bilateral pleural effusion and lobar collapse/consolidation.
Cephalized blood flow. Severe glenohumeral osteoarthritis on the
right with inferior subluxation.
IMPRESSION: Pleural effusions and large pericardial effusion with bilateral
lower lobe collapse. No change from CT earlier today.

## 2020-03-09 IMAGING — DX CHEST  1 VIEW
1 series · 1 of 1 positions shown · non-contrast
Comparison: 01/28/2019 chest radiograph and CT

CLINICAL DATA: Shortness of breath and weakness.

EXAM:
CHEST  1 VIEW

[chest]
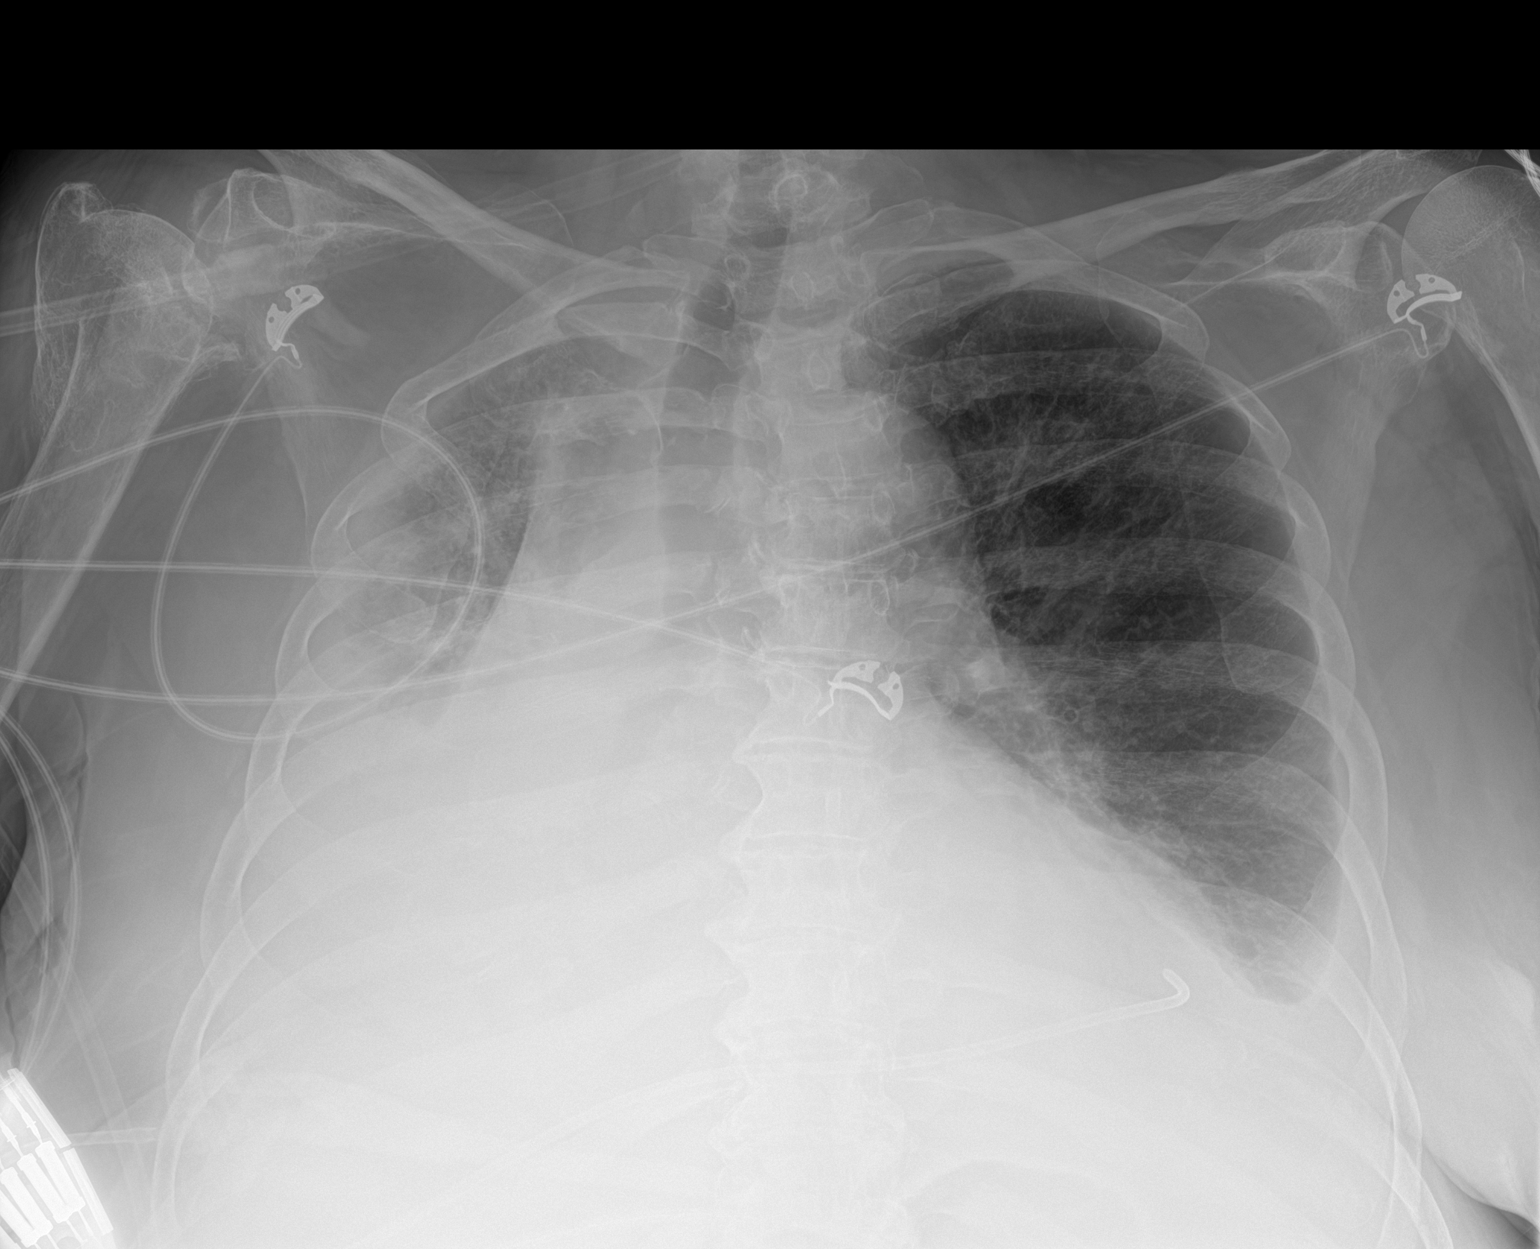

[1 of 1 positions shown; findings below may reference images not displayed]

FINDINGS: The patient is rotated to the right. A new pericardial drain is
present with decreased enlargement of the cardiopericardial
silhouette on the left. The right heart border is obscured by
progressive complete opacification of the lower half of the right
hemithorax with paramediastinal density extending more superiorly,
likely reflecting a combination of enlarging pleural effusion and
lung consolidation or collapse. There is a small left pleural
effusion with left basilar atelectasis. There is mild pulmonary
vascular congestion. No pneumothorax is identified.
IMPRESSION: 1. Interval pericardial drainage.
2. Substantial progression of right pleural effusion and right lung
consolidation or collapse.
3. Small left pleural effusion and left basilar atelectasis.

## 2020-03-10 IMAGING — DX PORTABLE CHEST - 1 VIEW
1 series · 1 of 1 positions shown · non-contrast
Comparison: 01/30/2019

CLINICAL DATA: Is Reason for exam: Respiratory failure

EXAM:
PORTABLE CHEST 1 VIEW

[chest ap]
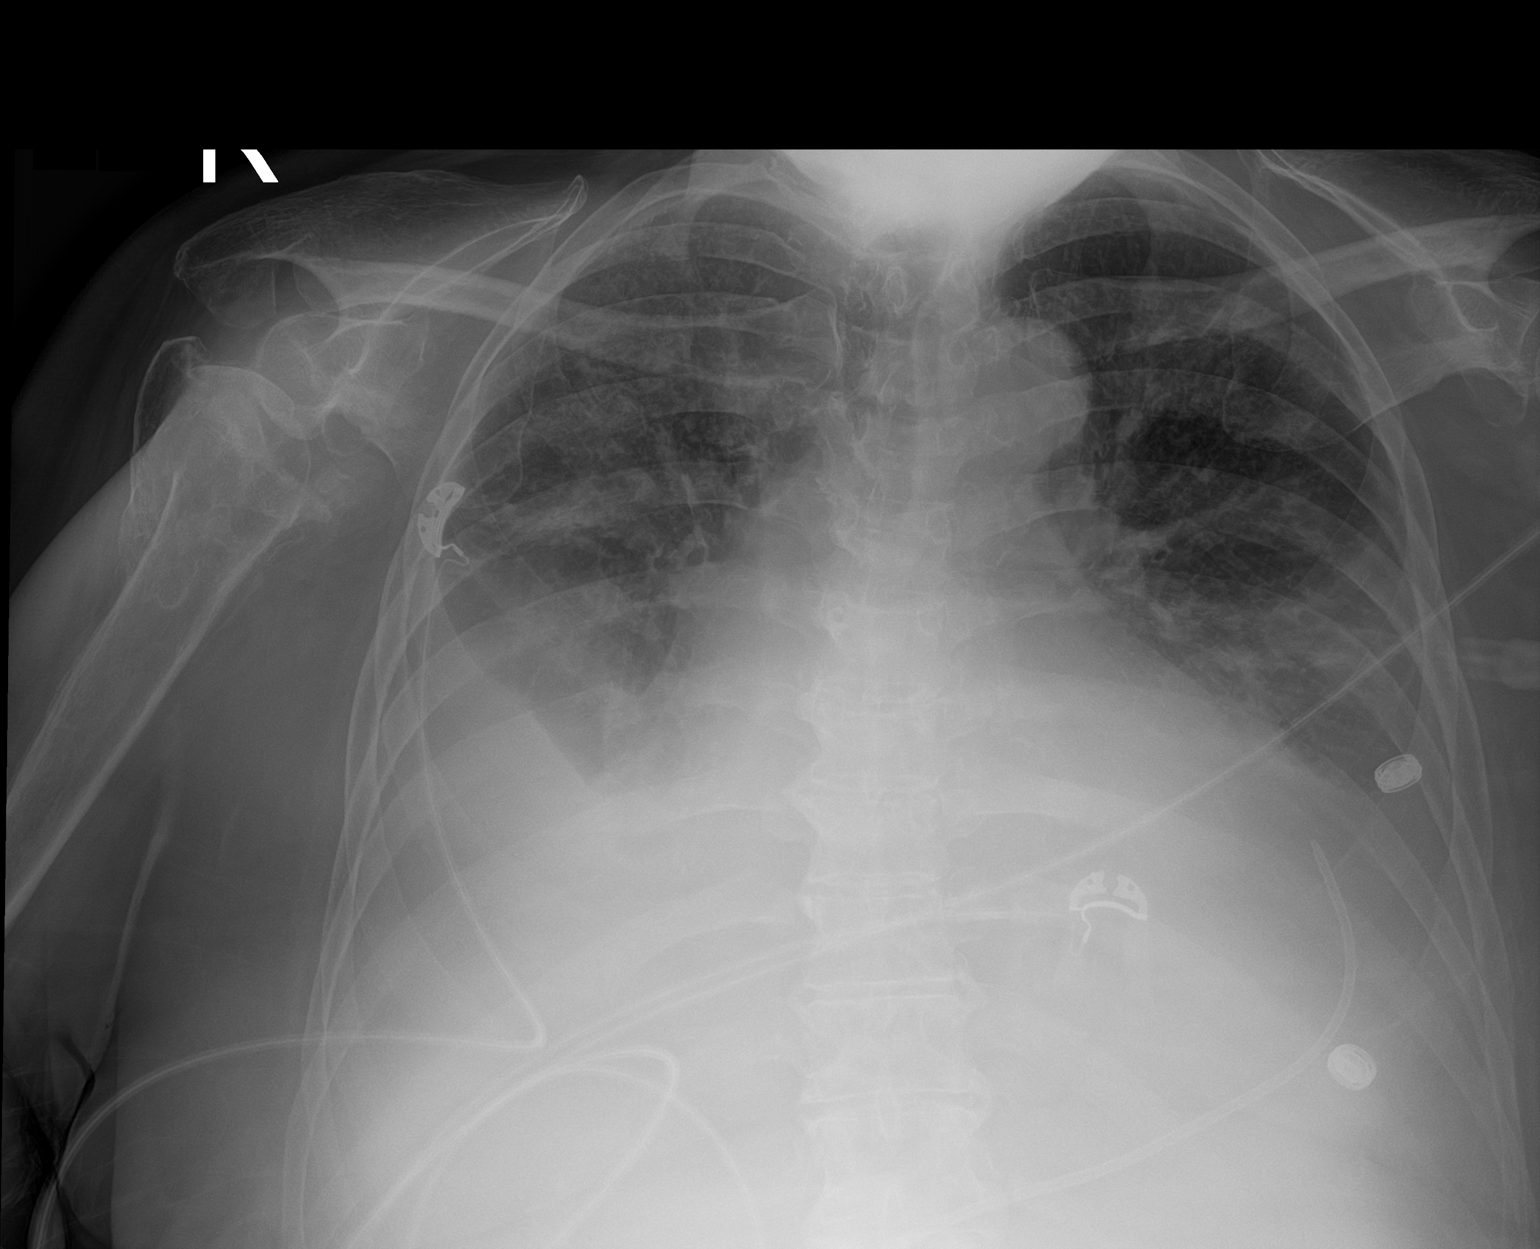

[1 of 1 positions shown; findings below may reference images not displayed]

FINDINGS: Stable enlarged cardiac silhouette. Bilateral pleural effusions
unchanged. Moderate effusion on the RIGHT and small effusion on the
LEFT. A small bore pericardial drain noted. No pneumothorax. No
pulmonary edema.
IMPRESSION: 1. No interval change.
2. Cardiomegaly and bilateral moderate pleural effusions.
